# Patient Record
Sex: Female | Born: 1950
Health system: Southern US, Community
[De-identification: ages and names within clinical notes are randomized; demographics above are authoritative.]

## PROBLEM LIST (undated history)

## (undated) DIAGNOSIS — Z87898 Personal history of other specified conditions: Secondary | ICD-10-CM

## (undated) DIAGNOSIS — I6529 Occlusion and stenosis of unspecified carotid artery: Secondary | ICD-10-CM

## (undated) DIAGNOSIS — B3321 Viral endocarditis: Secondary | ICD-10-CM

## (undated) DIAGNOSIS — K219 Gastro-esophageal reflux disease without esophagitis: Secondary | ICD-10-CM

## (undated) DIAGNOSIS — E785 Hyperlipidemia, unspecified: Secondary | ICD-10-CM

## (undated) HISTORY — PX: TUBAL LIGATION: SHX77

## (undated) HISTORY — DX: Personal history of other specified conditions: Z87.898

## (undated) HISTORY — DX: Hyperlipidemia, unspecified: E78.5

## (undated) HISTORY — DX: Occlusion and stenosis of unspecified carotid artery: I65.29

---

## 1998-04-13 ENCOUNTER — Inpatient Hospital Stay (HOSPITAL_COMMUNITY): Admission: AD | Admit: 1998-04-13 | Discharge: 1998-04-22 | Payer: Self-pay | Admitting: Cardiovascular Disease

## 1998-04-21 ENCOUNTER — Encounter: Payer: Self-pay | Admitting: Cardiovascular Disease

## 2000-11-14 ENCOUNTER — Other Ambulatory Visit: Admission: RE | Admit: 2000-11-14 | Discharge: 2000-11-14 | Payer: Self-pay | Admitting: *Deleted

## 2001-03-17 ENCOUNTER — Encounter: Payer: Self-pay | Admitting: *Deleted

## 2001-03-17 ENCOUNTER — Ambulatory Visit (HOSPITAL_COMMUNITY): Admission: RE | Admit: 2001-03-17 | Discharge: 2001-03-17 | Payer: Self-pay | Admitting: *Deleted

## 2001-06-02 ENCOUNTER — Encounter: Payer: Self-pay | Admitting: Emergency Medicine

## 2001-06-02 ENCOUNTER — Emergency Department (HOSPITAL_COMMUNITY): Admission: EM | Admit: 2001-06-02 | Discharge: 2001-06-02 | Payer: Self-pay | Admitting: Emergency Medicine

## 2001-12-08 ENCOUNTER — Ambulatory Visit (HOSPITAL_COMMUNITY): Admission: RE | Admit: 2001-12-08 | Discharge: 2001-12-08 | Payer: Self-pay | Admitting: Family Medicine

## 2001-12-08 ENCOUNTER — Encounter: Payer: Self-pay | Admitting: Family Medicine

## 2002-03-20 ENCOUNTER — Encounter: Payer: Self-pay | Admitting: *Deleted

## 2002-03-20 ENCOUNTER — Ambulatory Visit (HOSPITAL_COMMUNITY): Admission: RE | Admit: 2002-03-20 | Discharge: 2002-03-20 | Payer: Self-pay | Admitting: *Deleted

## 2003-03-25 ENCOUNTER — Ambulatory Visit (HOSPITAL_COMMUNITY): Admission: RE | Admit: 2003-03-25 | Discharge: 2003-03-25 | Payer: Self-pay | Admitting: *Deleted

## 2004-06-24 ENCOUNTER — Ambulatory Visit (HOSPITAL_COMMUNITY): Admission: RE | Admit: 2004-06-24 | Discharge: 2004-06-24 | Payer: Self-pay | Admitting: Obstetrics and Gynecology

## 2005-08-20 ENCOUNTER — Ambulatory Visit (HOSPITAL_COMMUNITY): Admission: RE | Admit: 2005-08-20 | Discharge: 2005-08-20 | Payer: Self-pay | Admitting: Obstetrics and Gynecology

## 2005-11-18 ENCOUNTER — Ambulatory Visit: Payer: Self-pay | Admitting: Orthopedic Surgery

## 2005-11-25 ENCOUNTER — Encounter: Payer: Self-pay | Admitting: Orthopedic Surgery

## 2006-09-14 ENCOUNTER — Ambulatory Visit (HOSPITAL_COMMUNITY): Admission: RE | Admit: 2006-09-14 | Discharge: 2006-09-14 | Payer: Self-pay | Admitting: Obstetrics

## 2006-09-26 ENCOUNTER — Ambulatory Visit (HOSPITAL_COMMUNITY): Admission: RE | Admit: 2006-09-26 | Discharge: 2006-09-26 | Payer: Self-pay | Admitting: Obstetrics & Gynecology

## 2007-09-15 ENCOUNTER — Ambulatory Visit (HOSPITAL_COMMUNITY): Admission: RE | Admit: 2007-09-15 | Discharge: 2007-09-15 | Payer: Self-pay | Admitting: Obstetrics

## 2008-09-16 ENCOUNTER — Ambulatory Visit (HOSPITAL_COMMUNITY): Admission: RE | Admit: 2008-09-16 | Discharge: 2008-09-16 | Payer: Self-pay | Admitting: Obstetrics

## 2009-03-19 ENCOUNTER — Encounter: Payer: Self-pay | Admitting: Orthopedic Surgery

## 2009-03-19 ENCOUNTER — Ambulatory Visit (HOSPITAL_COMMUNITY): Admission: RE | Admit: 2009-03-19 | Discharge: 2009-03-19 | Payer: Self-pay | Admitting: Family Medicine

## 2009-04-24 ENCOUNTER — Ambulatory Visit (HOSPITAL_COMMUNITY): Admission: RE | Admit: 2009-04-24 | Discharge: 2009-04-24 | Payer: Self-pay | Admitting: Family Medicine

## 2009-05-19 ENCOUNTER — Ambulatory Visit: Payer: Self-pay | Admitting: Orthopedic Surgery

## 2009-05-19 DIAGNOSIS — M19019 Primary osteoarthritis, unspecified shoulder: Secondary | ICD-10-CM | POA: Insufficient documentation

## 2009-05-19 DIAGNOSIS — M542 Cervicalgia: Secondary | ICD-10-CM

## 2009-05-19 DIAGNOSIS — M25519 Pain in unspecified shoulder: Secondary | ICD-10-CM | POA: Insufficient documentation

## 2009-05-19 DIAGNOSIS — M47812 Spondylosis without myelopathy or radiculopathy, cervical region: Secondary | ICD-10-CM | POA: Insufficient documentation

## 2009-05-20 ENCOUNTER — Encounter: Payer: Self-pay | Admitting: Orthopedic Surgery

## 2009-06-02 ENCOUNTER — Encounter (HOSPITAL_COMMUNITY): Admission: RE | Admit: 2009-06-02 | Discharge: 2009-07-02 | Payer: Self-pay | Admitting: Orthopedic Surgery

## 2009-08-04 ENCOUNTER — Encounter: Payer: Self-pay | Admitting: Orthopedic Surgery

## 2009-09-18 ENCOUNTER — Ambulatory Visit (HOSPITAL_COMMUNITY): Admission: RE | Admit: 2009-09-18 | Discharge: 2009-09-18 | Payer: Self-pay | Admitting: Obstetrics

## 2009-09-26 ENCOUNTER — Telehealth: Payer: Self-pay | Admitting: Cardiovascular Disease

## 2009-10-13 ENCOUNTER — Telehealth: Payer: Self-pay | Admitting: Cardiovascular Disease

## 2009-10-17 ENCOUNTER — Ambulatory Visit (HOSPITAL_COMMUNITY): Admission: RE | Admit: 2009-10-17 | Discharge: 2009-10-17 | Payer: Self-pay | Admitting: Obstetrics

## 2010-03-26 ENCOUNTER — Emergency Department (HOSPITAL_COMMUNITY)
Admission: EM | Admit: 2010-03-26 | Discharge: 2010-03-26 | Payer: Self-pay | Source: Home / Self Care | Admitting: Emergency Medicine

## 2010-03-26 LAB — BASIC METABOLIC PANEL
BUN: 16 mg/dL (ref 6–23)
CO2: 28 mEq/L (ref 19–32)
Calcium: 9.5 mg/dL (ref 8.4–10.5)
Chloride: 102 mEq/L (ref 96–112)
Creatinine, Ser: 0.87 mg/dL (ref 0.4–1.2)
GFR calc Af Amer: >60 mL/min (ref >60)
GFR calc non Af Amer: >60 mL/min (ref >60)
Glucose, Bld: 94 mg/dL (ref 70–99)
Potassium: 3.9 mEq/L (ref 3.5–5.1)
Sodium: 139 mEq/L (ref 135–145)

## 2010-03-26 LAB — CBC
HCT: 37.1 % (ref 36.0–46.0)
Hemoglobin: 12.2 g/dL (ref 12.0–15.0)
MCH: 28.8 pg (ref 26.0–34.0)
MCHC: 32.9 g/dL (ref 30.0–36.0)
MCV: 87.7 fL (ref 78.0–100.0)
Platelets: 255 10*3/uL (ref 150–400)
RBC: 4.23 MIL/uL (ref 3.87–5.11)
RDW: 13.9 % (ref 11.5–15.5)
WBC: 5.1 10*3/uL (ref 4.0–10.5)

## 2010-03-26 LAB — DIFFERENTIAL
Basophils Absolute: 0 10*3/uL (ref 0.0–0.1)
Basophils Relative: 1 % (ref 0–1)
Eosinophils Absolute: 0.2 10*3/uL (ref 0.0–0.7)
Eosinophils Relative: 4 % (ref 0–5)
Lymphocytes Relative: 42 % (ref 12–46)
Lymphs Abs: 2.1 10*3/uL (ref 0.7–4.0)
Monocytes Absolute: 0.4 10*3/uL (ref 0.1–1.0)
Monocytes Relative: 9 % (ref 3–12)
Neutro Abs: 2.3 10*3/uL (ref 1.7–7.7)
Neutrophils Relative %: 44 % (ref 43–77)

## 2010-03-26 LAB — D-DIMER, QUANTITATIVE: D-Dimer, Quant: 0.22 ug/mL-FEU (ref 0.00–0.48)

## 2010-03-26 LAB — POCT CARDIAC MARKERS
CKMB, poc: <1 ng/mL — ABNORMAL LOW (ref 1.0–8.0)
Myoglobin, poc: 75.2 ng/mL (ref 12–200)
Troponin i, poc: <0.05 ng/mL (ref 0.00–0.09)

## 2010-04-12 ENCOUNTER — Encounter: Payer: Self-pay | Admitting: Obstetrics & Gynecology

## 2010-04-12 ENCOUNTER — Encounter: Payer: Self-pay | Admitting: Obstetrics

## 2010-04-21 NOTE — Letter (Signed)
Summary: History form  History form   Imported By: Jacklynn Ganong 05/26/2009 10:59:23  _____________________________________________________________________  External Attachment:    Type:   Image     Comment:   External Document

## 2010-04-21 NOTE — Progress Notes (Signed)
Summary: pt wants to talk about getting pre meds  Phone Note Call from Patient Call back at Home Phone [phone]   Caller: Patient Reason for Call: Talk to Nurse, Talk to Doctor Summary of Call: pt wants to talk to MD not the nurse Initial call taken by: Omer Jack,  October 13, 2009 11:06 AM  Follow-up for Phone Call        spoke with pt, records from 2000 requested from the hosp and her old chart Deliah Goody, RN  October 13, 2009 11:46 AM  records received and reviewed with dr Riley Kill. pt with a hx of myopericardis. pt aware she does not need SBE before dental procedures Deliah Goody, RN  October 13, 2009 4:19 PM

## 2010-04-21 NOTE — Progress Notes (Signed)
Summary: Initial evaluation  Initial evaluation   Imported By: Jacklynn Ganong 05/16/2009 07:38:43  _____________________________________________________________________  External Attachment:    Type:   Image     Comment:   External Document

## 2010-04-21 NOTE — Letter (Signed)
Summary: *Orthopedic Consult Note  [facility]  [address]  [place], [place] [address]   Phone: [phone]  Fax: [phone]    Re:    Nicole Guerrero DOB:    01/22/1951   Dear: Loraine Leriche   Thank you for requesting that we see the above patient for consultation.  A copy of the detailed office note will be sent under separate cover, for your review.  Evaluation today is consistent with: cervical spondylosis and a.c. joint arthrosis   Our recommendation is for: physical therapy continued Tylenol.  She will return to [place] if she has further symptoms.  She is happy with this plan.       Thank you for this opportunity to look after your patient.  Sincerely,   Terrance Mass. MD.

## 2010-04-21 NOTE — Miscellaneous (Signed)
Summary: Discharge from OT  Discharge from OT   Imported By: Jacklynn Ganong 08/15/2009 08:52:24  _____________________________________________________________________  External Attachment:    Type:   Image     Comment:   External Document

## 2010-04-21 NOTE — Assessment & Plan Note (Signed)
Summary: RT SHOULDER PAIN XR MRI AT [facility]   Vital Signs:  Patient profile:   60 year old female Height:      62 inches Weight:      144 pounds Pulse rate:   76 / minute Resp:     16 per minute  Visit Type:  Initial Consult Referring Provider:  Dr. Nobie Putnam Primary Provider:  Dr. Nobie Putnam  CC:  right shoulder pain.  History of Present Illness: This is a 60 year old female with RIGHT shoulder and neck pain who presents for evaluation.  She has had an MRI.  She had x-rays done as well.  She had an x-ray over shoulder on December 29.  She is currently complaining of pain over her RIGHT collar bone the RIGHT side of her neck the RIGHT upper arm and the RIGHT glenohumeral joint.  She's had pain for months with no injury.  She has taken some Tylenol with moderate relief he didn't do too well.  The pain is described as sharp stabbing severe, rates it an 8/10, intermittent gradual in onset she reports a catching in the RIGHT shoulder as well denies bruising numbness tingling locking or swelling for neck stiffness   Xrays of the Right shoulder taken 03/19/09.  xrays of the C spine taken 03/19/09, MRI C spine on 04/24/09 [facility] for review.  rt c5-6 foraminal narrowing   Meds: Prilosec, Actonel, Multivitamin.  Allergies (verified): No Known Drug Allergies  Past History:  Past Medical History: Acid Reflux osteopenia  Family History: FH of Cancer:  Family History of Diabetes Family History of Arthritis Hx, family, asthma  Social History: Patient is divorced.  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 Cardiac :  Denies chest pain, angina, heart attack, heart failure, poor circulation, blood clots, and phlebitis. Resp:  Denies short of breath, difficulty breathing, COPD, cough, and pneumonia. GI:  Denies nausea, vomiting, diarrhea, constipation, difficulty  swallowing, ulcers, GERD, and reflux. GU:  Denies kidney failure, kidney transplant, kidney stones, burning, poor stream, testicular cancer, blood in urine, and . Neuro:  Denies headache, dizziness, migraines, numbness, weakness, tremor, and unsteady walking. MS:  Complains of joint pain; denies rheumatoid arthritis, joint swelling, gout, bone cancer, osteoporosis, and . Endo:  Denies thyroid disease, goiter, and diabetes. Psych:  Denies depression, mood swings, anxiety, panic attack, bipolar, and schizophrenia. Derm:  Denies eczema, cancer, and itching. EENT:  Denies poor vision, cataracts, glaucoma, poor hearing, vertigo, ears ringing, sinusitis, hoarseness, toothaches, and bleeding gums. Immunology:  Complains of seasonal allergies; denies sinus problems and allergic to bee stings. Lymphatic:  Denies lymph node cancer and lymph edema.  Physical Exam  Additional Exam:  GEN: well developed, well nourished, normal grooming and hygiene, no deformity and normal body habitus.   CDV: pulses are normal, no edema, no erythema. no tenderness  Lymph: normal lymph nodes   Skin: no rashes, skin lesions or open sores   NEURO: normal coordination, reflexes, sensation.   Psyche: awake, alert and oriented. Mood normal   Gait: normal  Cervical spine inspection reveals no tenderness full range of motion negative Spurling sign.  Motor exam normal with no increased muscle tension.  No joint laxity.  RIGHT shoulder evaluation inspection reveals tenderness over the a.c. joint a positive abduction test.  Normal range of motion no impingement normal strength in the rotator cuff no joint laxity.  Impression & Recommendations:  Problem # 1:  CERVICAL SPONDYLOSIS WITHOUT MYELOPATHY (ICD-721.0) multilevel cervical disc disease with C5-C6 foraminal stenosis Orders: New Patient Level III ([address])  Problem # 2:  OSTEOARTHRITIS, SHOULDER (ICD-715.91) a.c. joint Orders: New Patient Level III  ([address]) after reviewing the reports and the films available to me which included the RIGHT shoulder the cervical spine and the MRI of the cervical spine the patient's symptoms right now controlled with Tylenol slightly simple exercise program patient education should be sufficient she will continue Tylenol and come back to [place] as needed she will get some therapy on the shoulder and the cervical spine  Other Orders: Physical Therapy Referral (PT)  Patient Instructions: 1)  Do therapy for neck and shoulder for 6 weeks 2)  Continue Tylenol 3)  Come back as needed

## 2010-04-21 NOTE — Progress Notes (Signed)
Summary: dose pt need pre med prior to dental appt  Phone Note Call from Patient Call back at Home Phone [phone]   Caller: Patient Reason for Call: Talk to Nurse, Talk to Doctor Summary of Call: Does patient have to pre med prior to dental appt Initial call taken by: Omer Jack,  September 26, 2009 10:35 AM  Follow-up for Phone Call        pt states we saw her in 2000. left her a message of what the guidelines are and that I was unable to locate her history or records of her being seen. she will call back with any questions Deliah Goody, RN  September 26, 2009 4:26 PM

## 2010-09-09 ENCOUNTER — Other Ambulatory Visit: Payer: Self-pay | Admitting: Obstetrics

## 2010-09-09 DIAGNOSIS — Z139 Encounter for screening, unspecified: Secondary | ICD-10-CM

## 2010-09-21 ENCOUNTER — Ambulatory Visit (HOSPITAL_COMMUNITY)
Admission: RE | Admit: 2010-09-21 | Discharge: 2010-09-21 | Disposition: A | Discharge status: Home / Self Care | Payer: BC Managed Care – PPO | Source: Ambulatory Visit | Attending: Obstetrics | Admitting: Obstetrics

## 2010-09-21 DIAGNOSIS — Z1231 Encounter for screening mammogram for malignant neoplasm of breast: Secondary | ICD-10-CM | POA: Insufficient documentation

## 2010-09-21 DIAGNOSIS — Z139 Encounter for screening, unspecified: Secondary | ICD-10-CM

## 2010-11-17 ENCOUNTER — Ambulatory Visit (INDEPENDENT_AMBULATORY_CARE_PROVIDER_SITE_OTHER): Payer: BC Managed Care – PPO | Admitting: Urology

## 2010-11-17 DIAGNOSIS — N952 Postmenopausal atrophic vaginitis: Secondary | ICD-10-CM

## 2010-11-17 DIAGNOSIS — N3000 Acute cystitis without hematuria: Secondary | ICD-10-CM

## 2011-05-10 ENCOUNTER — Other Ambulatory Visit: Payer: Self-pay | Admitting: Obstetrics

## 2011-05-10 DIAGNOSIS — Z139 Encounter for screening, unspecified: Secondary | ICD-10-CM

## 2011-09-27 ENCOUNTER — Ambulatory Visit (HOSPITAL_COMMUNITY)
Admission: RE | Admit: 2011-09-27 | Discharge: 2011-09-27 | Disposition: A | Discharge status: Home / Self Care | Payer: BC Managed Care – PPO | Source: Ambulatory Visit | Attending: Obstetrics | Admitting: Obstetrics

## 2011-09-27 DIAGNOSIS — Z139 Encounter for screening, unspecified: Secondary | ICD-10-CM

## 2011-09-27 DIAGNOSIS — Z1231 Encounter for screening mammogram for malignant neoplasm of breast: Secondary | ICD-10-CM | POA: Insufficient documentation

## 2011-10-18 ENCOUNTER — Emergency Department (HOSPITAL_COMMUNITY)
Admission: EM | Admit: 2011-10-18 | Discharge: 2011-10-18 | Disposition: A | Discharge status: Home / Self Care | Payer: BC Managed Care – PPO | Attending: Emergency Medicine | Admitting: Emergency Medicine

## 2011-10-18 ENCOUNTER — Encounter (HOSPITAL_COMMUNITY): Payer: Self-pay | Admitting: *Deleted

## 2011-10-18 DIAGNOSIS — Y93H9 Activity, other involving exterior property and land maintenance, building and construction: Secondary | ICD-10-CM | POA: Insufficient documentation

## 2011-10-18 DIAGNOSIS — T63461A Toxic effect of venom of wasps, accidental (unintentional), initial encounter: Secondary | ICD-10-CM | POA: Insufficient documentation

## 2011-10-18 DIAGNOSIS — Y998 Other external cause status: Secondary | ICD-10-CM | POA: Insufficient documentation

## 2011-10-18 DIAGNOSIS — T63441A Toxic effect of venom of bees, accidental (unintentional), initial encounter: Secondary | ICD-10-CM

## 2011-10-18 DIAGNOSIS — T6391XA Toxic effect of contact with unspecified venomous animal, accidental (unintentional), initial encounter: Secondary | ICD-10-CM | POA: Insufficient documentation

## 2011-10-18 MED ORDER — SODIUM CHLORIDE 0.9 % IV SOLN
INTRAVENOUS | Status: DC
  Administered 2011-10-18: 19:00:00 via INTRAVENOUS

## 2011-10-18 MED ORDER — METHYLPREDNISOLONE SODIUM SUCC 40 MG IJ SOLR
120.0000 mg | Freq: Once | INTRAMUSCULAR | Status: AC
  Administered 2011-10-18: 120 mg via INTRAVENOUS
  Filled 2011-10-18: qty 3

## 2011-10-18 MED ORDER — FAMOTIDINE IN NACL 20-0.9 MG/50ML-% IV SOLN
20.0000 mg | Freq: Once | INTRAVENOUS | Status: AC
  Administered 2011-10-18: 20 mg via INTRAVENOUS
  Filled 2011-10-18: qty 50

## 2011-10-18 MED ORDER — DIPHENHYDRAMINE HCL 50 MG/ML IJ SOLN
50.0000 mg | Freq: Once | INTRAMUSCULAR | Status: AC
  Administered 2011-10-18: 50 mg via INTRAVENOUS
  Filled 2011-10-18: qty 1

## 2011-10-18 MED ORDER — PREDNISONE 20 MG PO TABS: ORAL_TABLET | ORAL | Status: DC

## 2011-10-18 MED ORDER — FAMOTIDINE 20 MG PO TABS: 20.0000 mg | ORAL_TABLET | Freq: Two times a day (BID) | ORAL | Status: DC

## 2011-10-18 NOTE — ED Notes (Signed)
Multiple bee stings just PTA while working in yard. Denies SOB. Welts all over arms, legs, and back.

## 2011-10-18 NOTE — ED Provider Notes (Cosign Needed)
History     CSN: [id number]  Arrival date & time 10/18/11  1711   First MD Initiated Contact with Patient 10/18/11 1824      Chief Complaint  Patient presents with  . Multiple bee stings     (Consider location/radiation/quality/duration/timing/severity/associated sxs/prior treatment) HPI  Patient relates she had been raking pairs that had fallen on the ground and was pulling weeds when some yellow jackets came out of the ground and stung her approximately 5 PM. She states she washed all the sites and then put on some cream that she had at home. She also took a Zyrtec. She states she's never had reactions to bee stings in the past but she's never been stung many times. She states she has counted 9 sting sites. She denies having swelling in her throat but she states she did have some numbness and a feeling of swelling of her lips. She denies chest pain.  PCP Dr. Phillips Odor  History reviewed. No pertinent past medical history.  Past Surgical History  Procedure Date  . Tubal ligation     No family history on file.  History  Substance Use Topics  . Smoking status: Never Smoker   . Smokeless tobacco: Not on file  . Alcohol Use: No  employed  OB History    Grav Para Term Preterm Abortions TAB SAB Ect Mult Living                  Review of Systems  All other systems reviewed and are negative.    Allergies  Review of patient's allergies indicates no known allergies.  Home Medications   Current Outpatient Rx  Name Route Sig Dispense Refill  . CETIRIZINE HCL 10 MG PO TABS Oral Take 10 mg by mouth once as needed. For allergic reaction    . VITAMIN D 2000 UNITS PO CAPS Oral Take 1 capsule by mouth every morning.    . ADULT MULTIVITAMIN W/MINERALS CH Oral Take 1 tablet by mouth every morning.    Marland Kitchen OMEPRAZOLE 20 MG PO CPDR Oral Take 20 mg by mouth every morning.      BP 140/68  Pulse 99  Temp 98.4 F (36.9 C) (Oral)  Resp 20  Ht 5\' 2"  (1.575 m)  Wt 140 lb (63.504 kg)   BMI 25.61 kg/m2  SpO2 98%  Vital signs normal    Physical Exam  Nursing note and vitals reviewed. Constitutional: She is oriented to person, place, and time. She appears well-developed and well-nourished.  Non-toxic appearance. She does not appear ill. No distress.  HENT:  Head: Normocephalic and atraumatic.  Right Ear: External ear normal.  Left Ear: External ear normal.  Nose: Nose normal. No mucosal edema or rhinorrhea.  Mouth/Throat: Oropharynx is clear and moist and mucous membranes are normal. No dental abscesses or uvula swelling.  Eyes: Conjunctivae and EOM are normal. Pupils are equal, round, and reactive to light.  Neck: Normal range of motion and full passive range of motion without pain. Neck supple.  Cardiovascular: Normal rate, regular rhythm and normal heart sounds.  Exam reveals no gallop and no friction rub.   No murmur heard. Pulmonary/Chest: Effort normal and breath sounds normal. No respiratory distress. She has no wheezes. She has no rhonchi. She has no rales. She exhibits no tenderness and no crepitus.  Abdominal: Soft. Normal appearance and bowel sounds are normal. She exhibits no distension. There is no tenderness. There is no rebound and no guarding.  Musculoskeletal: Normal range of  motion. She exhibits no edema and no tenderness.       Moves all extremities well.   Neurological: She is alert and oriented to person, place, and time. She has normal strength. No cranial nerve deficit.  Skin: Skin is warm, dry and intact. No rash noted. There is erythema. No pallor.       Patient's noted to have some swelling of the dorsum of her right hand with some mild redness. She is noted to have another sting site on her right forearm with erythema that is approximately 5 cm in size, she is noted to have 2 areas on her left forearm also over 5 cm in size with erythema, just 2 smaller areas of the dorsum of both her feet near the MTPs of her middle toes that are consistent with  sting sites. Her back has diffuse scattered redness.  Psychiatric: She has a normal mood and affect. Her speech is normal and behavior is normal. Her mood appears not anxious.    ED Course  Procedures (including critical care time)   Medications  0.9 %  sodium chloride infusion (not administered)  diphenhydrAMINE (BENADRYL) injection 50 mg (not administered)  methylPREDNISolone sodium succinate (SOLU-MEDROL) 40 mg/mL injection 120 mg (not administered)  famotidine (PEPCID) IVPB 20 mg (not administered)    Recheck her lesions are fading, she is feeling better.   1. Bee sting reaction     New Prescriptions   FAMOTIDINE (PEPCID) 20 MG TABLET    Take 1 tablet (20 mg total) by mouth 2 (two) times daily.   PREDNISONE (DELTASONE) 20 MG TABLET    Take 3 po QD x 2d starting tomorrow, then 2 po QD x 3d then 1 po QD x 3d    Plan discharge  Devoria Albe, MD, Armando Gang   MDM          Ward Givens, MD 10/18/11 2016

## 2011-10-18 NOTE — ED Notes (Signed)
Discharge instructions reviewed with pt, questions answered. Pt verbalized understanding.  

## 2011-10-18 NOTE — ED Notes (Signed)
Pt took a zyrtec PTA

## 2012-09-05 ENCOUNTER — Other Ambulatory Visit: Payer: Self-pay | Admitting: Obstetrics

## 2012-09-05 DIAGNOSIS — Z139 Encounter for screening, unspecified: Secondary | ICD-10-CM

## 2012-09-28 ENCOUNTER — Ambulatory Visit (HOSPITAL_COMMUNITY)
Admission: RE | Admit: 2012-09-28 | Discharge: 2012-09-28 | Disposition: A | Discharge status: Home / Self Care | Payer: BC Managed Care – PPO | Source: Ambulatory Visit | Attending: Obstetrics | Admitting: Obstetrics

## 2012-09-28 DIAGNOSIS — Z139 Encounter for screening, unspecified: Secondary | ICD-10-CM

## 2012-09-28 DIAGNOSIS — Z1231 Encounter for screening mammogram for malignant neoplasm of breast: Secondary | ICD-10-CM | POA: Insufficient documentation

## 2013-05-04 ENCOUNTER — Emergency Department (HOSPITAL_COMMUNITY)
Admission: EM | Admit: 2013-05-04 | Discharge: 2013-05-04 | Disposition: A | Discharge status: Home / Self Care | Payer: BC Managed Care – PPO | Attending: Emergency Medicine | Admitting: Emergency Medicine

## 2013-05-04 ENCOUNTER — Emergency Department (HOSPITAL_COMMUNITY): Payer: BC Managed Care – PPO

## 2013-05-04 ENCOUNTER — Encounter (HOSPITAL_COMMUNITY): Payer: Self-pay | Admitting: Emergency Medicine

## 2013-05-04 DIAGNOSIS — W108XXA Fall (on) (from) other stairs and steps, initial encounter: Secondary | ICD-10-CM | POA: Insufficient documentation

## 2013-05-04 DIAGNOSIS — Z79899 Other long term (current) drug therapy: Secondary | ICD-10-CM | POA: Insufficient documentation

## 2013-05-04 DIAGNOSIS — S8002XA Contusion of left knee, initial encounter: Secondary | ICD-10-CM

## 2013-05-04 DIAGNOSIS — Y929 Unspecified place or not applicable: Secondary | ICD-10-CM | POA: Insufficient documentation

## 2013-05-04 DIAGNOSIS — K219 Gastro-esophageal reflux disease without esophagitis: Secondary | ICD-10-CM | POA: Insufficient documentation

## 2013-05-04 DIAGNOSIS — S93401A Sprain of unspecified ligament of right ankle, initial encounter: Secondary | ICD-10-CM

## 2013-05-04 DIAGNOSIS — Z87891 Personal history of nicotine dependence: Secondary | ICD-10-CM | POA: Insufficient documentation

## 2013-05-04 DIAGNOSIS — Y939 Activity, unspecified: Secondary | ICD-10-CM | POA: Insufficient documentation

## 2013-05-04 DIAGNOSIS — S8000XA Contusion of unspecified knee, initial encounter: Secondary | ICD-10-CM | POA: Insufficient documentation

## 2013-05-04 DIAGNOSIS — S93409A Sprain of unspecified ligament of unspecified ankle, initial encounter: Secondary | ICD-10-CM | POA: Insufficient documentation

## 2013-05-04 HISTORY — DX: Gastro-esophageal reflux disease without esophagitis: K21.9

## 2013-05-04 NOTE — ED Provider Notes (Signed)
CSN: [id number]     Arrival date & time 05/04/13  1744 History   First MD Initiated Contact with Patient 05/04/13 1807     Chief Complaint  Patient presents with  . Fall     (Consider location/radiation/quality/duration/timing/severity/associated sxs/prior Treatment) HPI Comments: Nicole Guerrero is a 63 y.o. female who presents to the Emergency Department complaining of pain to her left knee and right lower leg and ankle.  States she "missed a step" and fell forward onto her knee.  Injury occurred just PTA.  She states she is able to bear weight, but has pain to her ankle and and left knee "feels like its going to give away".  Pain improves with rest, she denies head injury, LOC, dizziness, back or neck pain.  She has not tried any therapies prior to ed arrival.    The history is provided by the patient.    Past Medical History  Diagnosis Date  . GERD (gastroesophageal reflux disease)    Past Surgical History  Procedure Laterality Date  . Tubal ligation     Family History  Problem Relation Age of Onset  . Diabetes Other   . Cancer Other    History  Substance Use Topics  . Smoking status: Former Smoker -- 0.03 packs/day for 6 years    Types: Cigarettes    Quit date: 03/22/1997  . Smokeless tobacco: Never Used  . Alcohol Use: No   OB History   Grav Para Term Preterm Abortions TAB SAB Ect Mult Living   3 3 3       3      Review of Systems  Constitutional: Negative for fever and chills.  Gastrointestinal: Negative for abdominal pain.  Genitourinary: Negative for dysuria and difficulty urinating.  Musculoskeletal: Positive for arthralgias and gait problem. Negative for back pain, joint swelling and neck pain.  Skin: Negative for color change and wound.  Neurological: Negative for dizziness, syncope, weakness, numbness and headaches.  All other systems reviewed and are negative.      Allergies  Review of patient's allergies indicates no known allergies.  Home  Medications   Current Outpatient Rx  Name  Route  Sig  Dispense  Refill  . Cholecalciferol (VITAMIN D) 2000 UNITS CAPS   Oral   Take 1 capsule by mouth every morning.         . Multiple Vitamin (MULTIVITAMIN WITH MINERALS) TABS   Oral   Take 1 tablet by mouth every morning.         Marland Kitchen omeprazole (PRILOSEC OTC) 20 MG tablet   Oral   Take 20 mg by mouth daily.          BP 133/77  Pulse 76  Temp(Src) 97.9 F (36.6 C) (Oral)  Resp 18  Ht 5\' 2"  (1.575 m)  Wt 142 lb (64.411 kg)  BMI 25.97 kg/m2  SpO2 98% Physical Exam  Nursing note and vitals reviewed. Constitutional: She is oriented to person, place, and time. She appears well-developed and well-nourished. No distress.  HENT:  Head: Normocephalic and atraumatic.  Neck: Normal range of motion.  Cardiovascular: Normal rate, regular rhythm, normal heart sounds and intact distal pulses.   Pulmonary/Chest: Effort normal and breath sounds normal. She exhibits no tenderness.  Musculoskeletal: She exhibits tenderness. She exhibits no edema.       Left knee: She exhibits normal range of motion, no swelling, no effusion, no deformity, no erythema, normal alignment and no bony tenderness. Tenderness found. No patellar tendon tenderness  noted.       Right ankle: She exhibits normal range of motion, no swelling, no ecchymosis, no deformity, no laceration and normal pulse. Tenderness. Lateral malleolus tenderness found. No head of 5th metatarsal and no proximal fibula tenderness found. Achilles tendon normal.       Legs: ttp of the anterior left knee.  No erythema, effusion, or step-off deformity. ttp of the lateral right ankle. No bruising, bony deformity or STS.  DP pulse brisk, distal sensation intact. Calf is soft and NT. Pt has full ROM of bilateral hips.  No spinal tenderness.  Neurological: She is alert and oriented to person, place, and time. She exhibits normal muscle tone. Coordination normal.  Skin: Skin is warm and dry. No  erythema.    ED Course  Procedures (including critical care time) Labs Review Labs Reviewed - No data to display Imaging Review Dg Tibia/fibula Right  05/04/2013   CLINICAL DATA:  Fall, foot and leg pain  EXAM: RIGHT TIBIA AND FIBULA - 2 VIEW  COMPARISON:  Concurrently obtained radiographs of the right foot and left knee  FINDINGS: Remote healed fibula fracture. No evidence of acute fracture or malalignment. Mild degenerative change noted in the incompletely imaged knee joint. There is been narrowing in the medial joint space and early endplate sclerosis. Normal bony mineralization. No lytic or blastic osseous lesion.  IMPRESSION: 1. No acute fracture or malalignment. 2. Remote healed right fibular fracture. 3. Incompletely imaged degenerative changes in the knee joint.   Electronically Signed   By: Jacqulynn Cadet M.D.   On: 05/04/2013 19:06   Dg Knee Complete 4 Views Left  05/04/2013   CLINICAL DATA:  [place] on stairs, right foot and leg pain  EXAM: LEFT KNEE - COMPLETE 4+ VIEW  COMPARISON:  Concurrently obtained radiographs of the foot and left lower leg  FINDINGS: There is no evidence of fracture, dislocation, or joint effusion. Narrowing of the medial joint space consistent with mild osteoarthritis. Osteophyte formation is noted at the medial tibial plateau. Normal bony mineralization. No lytic or blastic osseous lesion. . Soft tissues are unremarkable.  IMPRESSION: No acute fracture or malalignment.  No joint effusion.  Mild degenerative changes in the medial compartment.   Electronically Signed   By: Jacqulynn Cadet M.D.   On: 05/04/2013 19:05   Dg Foot Complete Right  05/04/2013   CLINICAL DATA:  Fall.  EXAM: RIGHT FOOT COMPLETE - 3+ VIEW  COMPARISON:  None.  FINDINGS: There is no evidence of fracture or dislocation. There is no evidence of arthropathy or other focal bone abnormality. Soft tissues are unremarkable.  IMPRESSION: Negative.   Electronically Signed   By: Marin Olp M.D.   On:  05/04/2013 19:01    EKG Interpretation   None       X-ray reviewed and discussed.    MDM   Final diagnoses:  Sprain of right ankle  Contusion of knee, left   Vital reviewed, x rays reviewed and discussed with pt.  Pt with a fall to the right left and probable twisting injury of the left knee.  Ambulates with a slow but steady gait.  No NV deficits.  Has full ROM of the bilateral hips, pt agrees to RICE therapy and f/u with Dr. Aline Brochure for recheck  Patient was offered pain medication but declined stating that she has Tylenol at home and feels that she will be sufficient with that.  Wille Aubuchon L. [place], PA-C 05/06/13 1211

## 2013-05-04 NOTE — Discharge Instructions (Signed)
Ankle Sprain °An ankle sprain is an injury to the strong, fibrous tissues (ligaments) that hold your ankle bones together.  °HOME CARE  °· Put ice on your ankle for 1 2 days or as told by your doctor. °· Put ice in a plastic bag. °· Place a towel between your skin and the bag. °· Leave the ice on for 15-20 minutes at a time, every 2 hours while you are awake. °· Only take medicine as told by your doctor. °· Raise (elevate) your injured ankle above the level of your heart as much as possible for 2 3 days. °· Use crutches if your doctor tells you to. Slowly put your own weight on the affected ankle. Use the crutches until you can walk without pain. °· If you have a plaster splint: °· Do not rest it on anything harder than a pillow for 24 hours. °· Do not put weight on it. °· Do not get it wet. °· Take it off to shower or bathe. °· If given, use an elastic wrap or support stocking for support. Take the wrap off if your toes lose feeling (numb), tingle, or turn cold or blue. °· If you have an air splint: °· Add or let out air to make it comfortable. °· Take it off at night and to shower and bathe. °· Wiggle your toes and move your ankle up and down often while you are wearing it. °GET HELP RIGHT AWAY IF:  °· Your toes lose feeling (numb) or turn blue. °· You have severe pain that is increasing. °· You have rapidly increasing bruising or puffiness (swelling). °· Your toes feel very cold. °· You lose feeling in your foot. °· Your medicine does not help your pain. °MAKE SURE YOU:  °· Understand these instructions. °· Will watch your condition. °· Will get help right away if you are not doing well or get worse. °Document Released: 08/25/2007 Document Revised: 12/01/2011 Document Reviewed: 09/20/2011 °[employer]® Patient Information ©2014 [employer]. ° °Contusion °A contusion is a deep bruise. Contusions happen when an injury causes bleeding under the skin. Signs of bruising include pain, puffiness (swelling), and  discolored skin. The contusion may turn blue, purple, or yellow. °HOME CARE  °· Put ice on the injured area. °· Put ice in a plastic bag. °· Place a towel between your skin and the bag. °· Leave the ice on for 15-20 minutes, 03-04 times a day. °· Only take medicine as told by your doctor. °· Rest the injured area. °· If possible, raise (elevate) the injured area to lessen puffiness. °GET HELP RIGHT AWAY IF:  °· You have more bruising or puffiness. °· You have pain that is getting worse. °· Your puffiness or pain is not helped by medicine. °MAKE SURE YOU:  °· Understand these instructions. °· Will watch your condition. °· Will get help right away if you are not doing well or get worse. °Document Released: 08/25/2007 Document Revised: 05/31/2011 Document Reviewed: 01/11/2011 °[employer]® Patient Information ©2014 [employer]. ° °

## 2013-05-04 NOTE — ED Notes (Signed)
Alert, Pain rt foot, lt knee and  Rt lower leg, fell down 1 step. No HI, no LOC>

## 2013-05-04 NOTE — ED Notes (Signed)
Patient reports falling down steps. Patient c/o right foot pain and left knee pain. Patient denies hitting head or LOC. Notable limping noted when patient ambulated to triage.

## 2013-05-06 NOTE — ED Provider Notes (Signed)
  Medical screening examination/treatment/procedure(s) were performed by non-physician practitioner and as supervising physician I was immediately available for consultation/collaboration.      Carmin Muskrat, MD 05/06/13 [phone]

## 2013-05-30 ENCOUNTER — Other Ambulatory Visit (HOSPITAL_COMMUNITY): Payer: Self-pay | Admitting: Family Medicine

## 2013-05-30 DIAGNOSIS — M5030 Other cervical disc degeneration, unspecified cervical region: Secondary | ICD-10-CM

## 2013-05-30 DIAGNOSIS — M5020 Other cervical disc displacement, unspecified cervical region: Secondary | ICD-10-CM

## 2013-06-05 ENCOUNTER — Ambulatory Visit (HOSPITAL_COMMUNITY)
Admission: RE | Admit: 2013-06-05 | Discharge: 2013-06-05 | Disposition: A | Discharge status: Home / Self Care | Payer: BC Managed Care – PPO | Source: Ambulatory Visit | Attending: Family Medicine | Admitting: Family Medicine

## 2013-06-05 DIAGNOSIS — M47812 Spondylosis without myelopathy or radiculopathy, cervical region: Secondary | ICD-10-CM | POA: Insufficient documentation

## 2013-06-05 DIAGNOSIS — M5020 Other cervical disc displacement, unspecified cervical region: Secondary | ICD-10-CM | POA: Insufficient documentation

## 2013-06-05 DIAGNOSIS — M5030 Other cervical disc degeneration, unspecified cervical region: Secondary | ICD-10-CM | POA: Insufficient documentation

## 2013-08-06 ENCOUNTER — Other Ambulatory Visit: Payer: Self-pay | Admitting: Neurology

## 2013-08-06 ENCOUNTER — Ambulatory Visit (INDEPENDENT_AMBULATORY_CARE_PROVIDER_SITE_OTHER): Payer: BC Managed Care – PPO | Admitting: Neurology

## 2013-08-06 ENCOUNTER — Encounter: Payer: Self-pay | Admitting: Neurology

## 2013-08-06 VITALS — BP 118/76 | HR 81 | Ht 62.21 in | Wt 140.1 lb

## 2013-08-06 DIAGNOSIS — R937 Abnormal findings on diagnostic imaging of other parts of musculoskeletal system: Secondary | ICD-10-CM

## 2013-08-06 NOTE — Progress Notes (Signed)
NEUROLOGY CONSULTATION NOTE  Nicole Guerrero MRN: [id number] DOB: 09/15/1950  Referring provider: Dr. Hal Neer Primary care provider: Dr. Hilma Favors  Reason for consult:  Abnormal cervical MRI  HISTORY OF PRESENT ILLNESS: Nicole Guerrero is a 63 year old right-handed AA woman with GERD and remote history of viral endocarditis who presents for neck pain and signal abnormality on cervical MRI.Marland Kitchen  Records and images were personally reviewed.    She is suffered from right-sided neck pain for at least 2 years. The pain radiates on the right, from the shoulder up to the right side of her occipital region. It is exacerbated with movement of her head to the right. There is no shooting pain down the arm nor numbness involving the arms. She denies any numbness in the legs. She did report cramping on the right leg and right arm, which has since resolved after starting vitamin D supplements 4 vitamin D deficiency. Occasionally, she notes soreness in the right hip and her right leg as a sensation that it's a little weaker. She was treated by a local neurologist in [facility] about a couple of years ago. She reports an MRI of the brain at that time, but results are not available. She had undergone physical therapy which helped. More recently, the neck pain got worse. She had an MRI of the cervical spine without contrast performed on 06/05/13, which showed hyperintensity noted in the right lateral cord at the C5-6 level, measuring 5 mm on axial and 18 mm on sagittal images, not present on prior scan from 04/25/09.  Mild to moderate stenosis is noted at that level as well. She was referred to Dr. Hal Neer of neurosurgery, who thought evaluation by neurology was more appropriate.    She denies prior history of numbness of the body with or without a sensory level. She denies bowel or bladder incontinence. She denies episodes of transient visual disturbance. She has not had any falls.    PAST MEDICAL HISTORY: Past Medical  History  Diagnosis Date  . GERD (gastroesophageal reflux disease)     PAST SURGICAL HISTORY: Past Surgical History  Procedure Laterality Date  . Tubal ligation      MEDICATIONS: Current Outpatient Prescriptions on File Prior to Visit  Medication Sig Dispense Refill  . Cholecalciferol (VITAMIN D) 2000 UNITS CAPS Take 1 capsule by mouth every morning.      . Multiple Vitamin (MULTIVITAMIN WITH MINERALS) TABS Take 1 tablet by mouth every morning.      Marland Kitchen omeprazole (PRILOSEC OTC) 20 MG tablet Take 20 mg by mouth daily.       No current facility-administered medications on file prior to visit.    ALLERGIES: No Known Allergies  FAMILY HISTORY: Family History  Problem Relation Age of Onset  . Diabetes Other   . Cancer Other     SOCIAL HISTORY: History   Social History  . Marital Status: Divorced    Spouse Name: N/A    Number of Children: N/A  . Years of Education: N/A   Occupational History  . Not on file.   Social History Main Topics  . Smoking status: Former Smoker -- 0.03 packs/day for 6 years    Types: Cigarettes    Quit date: 03/22/1997  . Smokeless tobacco: Never Used  . Alcohol Use: No  . Drug Use: No  . Sexual Activity: Not on file   Other Topics Concern  . Not on file   Social History Narrative  . No narrative on file  REVIEW OF SYSTEMS: Constitutional: No fevers, chills, or sweats, no generalized fatigue, change in appetite Eyes: No visual changes, double vision, eye pain Ear, nose and throat: No hearing loss, ear pain, nasal congestion, sore throat Cardiovascular: No chest pain, palpitations Respiratory:  No shortness of breath at rest or with exertion, wheezes GastrointestinaI: No nausea, vomiting, diarrhea, abdominal pain, fecal incontinence Genitourinary:  No dysuria, urinary retention or frequency Musculoskeletal:  Neck pain, slight right hip discomfort Integumentary: No rash, pruritus, skin lesions Neurological: as above Psychiatric: No  depression, insomnia, anxiety Endocrine: No palpitations, fatigue, diaphoresis, mood swings, change in appetite, change in weight, increased thirst Hematologic/Lymphatic:  No anemia, purpura, petechiae. Allergic/Immunologic: no itchy/runny eyes, nasal congestion, recent allergic reactions, rashes  PHYSICAL EXAM: Filed Vitals:   08/06/13 1441  BP: 118/76  Pulse: 81   General: No acute distress Head:  Normocephalic/atraumatic Neck: supple, right sided tenderness, full range of motion Back: No paraspinal tenderness Heart: regular rate and rhythm Lungs: Clear to auscultation bilaterally. Vascular: No carotid bruits. Neurological Exam: Mental status: alert and oriented to person, place, and time, recent and remote memory intact, fund of knowledge intact, attention and concentration intact, speech fluent and not dysarthric, language intact. Cranial nerves: CN I: not tested CN II: pupils equal, round and reactive to light, visual fields intact, fundi unremarkable, without vessel changes, exudates, hemorrhages or papilledema. CN III, IV, VI:  full range of motion, no nystagmus, no ptosis CN V: facial sensation intact CN VII: upper and lower face symmetric CN VIII: hearing intact CN IX, X: gag intact, uvula midline CN XI: sternocleidomastoid and trapezius muscles intact CN XII: tongue midline Bulk & Tone: normal, no fasciculations. Motor: 5 out of 5 throughout Sensation: Pinprick and vibration intact Deep Tendon Reflexes: 2+ throughout, toes downgoing Finger to nose testing: No dysmetria Heel to shin: No dysmetria Gait: Normal station and stride. Patient able to turn, walk on toes, and walk on heels and in tandem. Romberg negative.  IMPRESSION:  abnormal cervical spine MRI.  Etiology is broad.  She has no clear history for a demyelinating event.  PLAN: 1.  I preferred to get MRI of cervical spine with contrast to look for underlying malignancy, however patient objects to any IV dye  due to her prior history of viral endocarditis. 2.  She will get her prior imaging, including of the brain.  At that point, we may repeat an MRI of the brain.  Again, I would prefer to get with and without contrast, but she refuses this. 3.  Check serum B12, ANA, ESR, RF, Lyme 4.  Next step would be LP to evaluate CSF for cell count, protein, glucose, oligoclonal bands, IgG index, cytology, flow cytometry, Lyme 5.  Follow up in 4 weeks.  45 minutes spent with patient, over 50% spent reviewing MRI with her, counseling and coordinating care.  Thank you for allowing me to take part in the care of this patient.  Metta Clines, DO  CC:  Karie Chimera, MD  Sharilyn Sites, MD

## 2013-08-06 NOTE — Patient Instructions (Signed)
1.  Get the MRIs you had in the past over to [place]. 2.  In the meantime, we will order MRI of the spinal cord in the neck and of the brain. 3.  We will check some blood work 4.  Likely, we will need to do a spinal tap following the MRI.

## 2013-08-07 LAB — RHEUMATOID FACTOR

## 2013-08-07 LAB — B. BURGDORFI ANTIBODIES: B BURGDORFERI AB IGG+ IGM: 0.51 {ISR}

## 2013-08-07 LAB — ANTI-NUCLEAR AB-TITER (ANA TITER)

## 2013-08-07 LAB — VITAMIN B12: Vitamin B-12: 690 pg/mL (ref 211–911)

## 2013-08-07 LAB — SEDIMENTATION RATE: SED RATE: 8 mm/h (ref 0–22)

## 2013-08-07 LAB — ANA: ANA: POSITIVE — AB

## 2013-08-14 ENCOUNTER — Telehealth: Payer: Self-pay | Admitting: *Deleted

## 2013-08-14 ENCOUNTER — Other Ambulatory Visit: Payer: Self-pay | Admitting: *Deleted

## 2013-08-14 ENCOUNTER — Telehealth: Payer: Self-pay | Admitting: Neurology

## 2013-08-14 DIAGNOSIS — R9389 Abnormal findings on diagnostic imaging of other specified body structures: Secondary | ICD-10-CM

## 2013-08-14 NOTE — Telephone Encounter (Signed)
Pt called/returning your call 10:05AM.

## 2013-08-14 NOTE — Telephone Encounter (Signed)
Message copied by Claudie Revering on Tue Aug 14, 2013  8:49 AM ------      Message from: JAFFE, ADAM R      Created: Thu Aug 09, 2013  7:09 AM       The ANA is positive.  This is a nonspecific test for an inflammatory condition.  The other inflammatory markers we tested were normal, so this may be of uncertain significance.  Some people have a positive ANA with no clinical symptoms of anything.  I would like to test more specific things that may cause a positive ANA, just to be complete.  I would like to check an Extractable Nuclear Antigen (ENA) panel.  All other tests were normal.      ----- Message -----         From: [facility]         Sent: 08/07/2013   1:39 PM           To: Dudley Major, DO                   ------

## 2013-08-14 NOTE — Telephone Encounter (Signed)
Patient is aware of the lab results and was ask to contact office to discuss lab results and ENA testing

## 2013-08-15 ENCOUNTER — Telehealth: Payer: Self-pay | Admitting: Neurology

## 2013-08-15 ENCOUNTER — Other Ambulatory Visit: Payer: Self-pay | Admitting: *Deleted

## 2013-08-15 NOTE — Telephone Encounter (Signed)
Pt called returning your call at 10:05AM C/b [phone]

## 2013-08-15 NOTE — Telephone Encounter (Signed)
I mailed patients lab slip to her for the ENA panel 9  To her with addresses for 2 locations close to where she lives and the times that they are open . Patients is aware

## 2013-08-21 ENCOUNTER — Telehealth: Payer: Self-pay | Admitting: Neurology

## 2013-08-21 LAB — ENA 9 PANEL
Centromere Ab Screen: <1
ENA SM AB SER-ACNC: NEGATIVE
Jo-1 Antibody, IgG: <1
Ribosomal P Protein Ab: <1
SM/RNP: NEGATIVE
SSA (RO) (ENA) ANTIBODY, IGG: NEGATIVE
SSB (La) (ENA) Antibody, IgG: <1
Scleroderma (Scl-70) (ENA) Antibody, IgG: <1
ds DNA Ab: <1 IU/mL

## 2013-08-21 NOTE — Telephone Encounter (Signed)
Pt has had her blood work done on 08-20-13

## 2013-08-23 ENCOUNTER — Telehealth: Payer: Self-pay | Admitting: *Deleted

## 2013-08-23 NOTE — Telephone Encounter (Signed)
Message copied by Claudie Revering on Thu Aug 23, 2013  9:18 AM ------      Message from: JAFFE, ADAM R      Created: Tue Aug 21, 2013  3:06 PM       Follow up blood work does not reveal any abnormalities to suggest a rheumatological condition.  Likely the positive ANA is of uncertain significance and not likely clinically relevant.       ----- Message -----         From: [facility]         Sent: 08/21/2013   2:10 PM           To: Dudley Major, DO                   ------

## 2013-08-23 NOTE — Telephone Encounter (Signed)
Left message on patients voice mail that labs did not show a rheumatological condition

## 2013-09-19 ENCOUNTER — Other Ambulatory Visit: Payer: Self-pay | Admitting: Obstetrics

## 2013-09-19 DIAGNOSIS — Z1231 Encounter for screening mammogram for malignant neoplasm of breast: Secondary | ICD-10-CM

## 2013-10-01 ENCOUNTER — Ambulatory Visit (HOSPITAL_COMMUNITY)
Admission: RE | Admit: 2013-10-01 | Discharge: 2013-10-01 | Disposition: A | Discharge status: Home / Self Care | Payer: BC Managed Care – PPO | Source: Ambulatory Visit | Attending: Obstetrics | Admitting: Obstetrics

## 2013-10-01 DIAGNOSIS — Z1231 Encounter for screening mammogram for malignant neoplasm of breast: Secondary | ICD-10-CM | POA: Insufficient documentation

## 2013-10-22 ENCOUNTER — Ambulatory Visit (INDEPENDENT_AMBULATORY_CARE_PROVIDER_SITE_OTHER): Payer: BC Managed Care – PPO | Admitting: Obstetrics

## 2013-10-22 ENCOUNTER — Encounter: Payer: Self-pay | Admitting: Obstetrics

## 2013-10-22 VITALS — BP 140/82 | HR 73 | Temp 98.5°F | Ht 62.0 in | Wt 140.0 lb

## 2013-10-22 DIAGNOSIS — IMO0001 Reserved for inherently not codable concepts without codable children: Secondary | ICD-10-CM | POA: Insufficient documentation

## 2013-10-22 DIAGNOSIS — IMO0002 Reserved for concepts with insufficient information to code with codable children: Secondary | ICD-10-CM

## 2013-10-22 DIAGNOSIS — Z01419 Encounter for gynecological examination (general) (routine) without abnormal findings: Secondary | ICD-10-CM

## 2013-10-23 ENCOUNTER — Encounter: Payer: Self-pay | Admitting: Obstetrics

## 2013-10-23 ENCOUNTER — Telehealth: Payer: Self-pay

## 2013-10-23 LAB — WET PREP BY MOLECULAR PROBE
Candida species: NEGATIVE
GARDNERELLA VAGINALIS: NEGATIVE
TRICHOMONAS VAG: NEGATIVE

## 2013-10-23 NOTE — Telephone Encounter (Signed)
Spoke with patient - referral in [facility] for Dr Dietrich Pates Silva's office - I called them and they said they would schedule appt and call patient - work in [facility] - called patient to let her know, 10/23/13

## 2013-10-23 NOTE — Progress Notes (Signed)
Subjective:     Nicole Guerrero is a 63 y.o. female here for a routine exam.  Current complaints: None.    Personal health questionnaire:  Is patient Nicole Guerrero, have a family history of breast and/or ovarian cancer: no Is there a family history of uterine cancer diagnosed at age < 50, gastrointestinal cancer, urinary tract cancer, family member who is a Field seismologist syndrome-associated carrier: no Is the patient overweight and hypertensive, family history of diabetes, personal history of gestational diabetes or PCOS: no Is patient over 16, have PCOS,  family history of premature CHD under age 26, diabetes, smoke, have hypertension or peripheral artery disease:  no At any time, has a partner hit, kicked or otherwise hurt or frightened you?: no Over the past 2 weeks, have you felt down, depressed or hopeless?: no Over the past 2 weeks, have you felt little interest or pleasure in doing things?:no   Gynecologic History No LMP recorded. Patient is postmenopausal. Contraception: Tubal ligation Last Pap: 2014. Results were: normal Last mammogram: 2014. Results were: normal  Obstetric History OB History  Gravida Para Term Preterm AB SAB TAB Ectopic Multiple Living  3 3 3       3     # Outcome Date GA Lbr Len/2nd Weight Sex Delivery Anes PTL Lv  3 TRM           2 TRM           1 TRM               Past Medical History  Diagnosis Date  . GERD (gastroesophageal reflux disease)     Past Surgical History  Procedure Laterality Date  . Tubal ligation      Current outpatient prescriptions:Cholecalciferol (VITAMIN D) 2000 UNITS CAPS, Take 1 capsule by mouth every morning., Disp: , Rfl: ;  Multiple Vitamin (MULTIVITAMIN WITH MINERALS) TABS, Take 1 tablet by mouth every morning., Disp: , Rfl: ;  omeprazole (PRILOSEC OTC) 20 MG tablet, Take 20 mg by mouth daily., Disp: , Rfl:  No Known Allergies  History  Substance Use Topics  . Smoking status: Former Smoker -- 0.03 packs/day for 6 years   Types: Cigarettes    Quit date: 03/22/1997  . Smokeless tobacco: Never Used  . Alcohol Use: No    Family History  Problem Relation Age of Onset  . Diabetes Other   . Cancer Other       Review of Systems  Constitutional: negative for fatigue and weight loss Respiratory: negative for cough and wheezing Cardiovascular: negative for chest pain, fatigue and palpitations Gastrointestinal: negative for abdominal pain and change in bowel habits Musculoskeletal:negative for myalgias Neurological: negative for gait problems and tremors Behavioral/Psych: negative for abusive relationship, depression Endocrine: negative for temperature intolerance   Genitourinary:negative for abnormal menstrual periods, genital lesions, hot flashes, sexual problems and vaginal discharge.  Positive for bulging mass in vagina with straining. Integument/breast: negative for breast lump, breast tenderness, nipple discharge and skin lesion(s)    Objective:       BP 140/82  Pulse 73  Temp(Src) 98.5 F (36.9 C)  Ht 5\' 2"  (1.575 m)  Wt 140 lb (63.504 kg)  BMI 25.60 kg/m2 General:   alert  Skin:   no rash or abnormalities  Lungs:   clear to auscultation bilaterally  Heart:   regular rate and rhythm, S1, S2 normal, no murmur, click, rub or gallop  Breasts:   normal without suspicious masses, skin or nipple changes or axillary nodes  Abdomen:  normal findings: no organomegaly, soft, non-tender and no hernia  Pelvis:  External genitalia: normal general appearance Urinary system: Grade 3 Cystocele Vaginal: normal without tenderness, induration or masses Cervix: normal appearance Adnexa: normal bimanual exam Uterus: anteverted and non-tender, normal size   Lab Review Urine pregnancy test Labs reviewed yes Radiologic studies reviewed no    Assessment:    Healthy female exam.   Cystocele, grade 3, no urinary incontinence.   Plan:   Refer to Urogynecology for evaluation of cystocele.  Education  reviewed: calcium supplements, low fat, low cholesterol diet, safe sex/STD prevention, self breast exams and weight bearing exercise. Follow up in: 1 year.   No orders of the defined types were placed in this encounter.   Orders Placed This Encounter  Procedures  . WET PREP BY MOLECULAR PROBE

## 2013-10-24 LAB — PAP IG AND HPV HIGH-RISK: HPV DNA HIGH RISK: NOT DETECTED

## 2014-01-21 ENCOUNTER — Encounter: Payer: Self-pay | Admitting: Obstetrics

## 2014-09-25 ENCOUNTER — Other Ambulatory Visit (HOSPITAL_COMMUNITY): Payer: Self-pay | Admitting: Family Medicine

## 2014-09-25 DIAGNOSIS — Z1231 Encounter for screening mammogram for malignant neoplasm of breast: Secondary | ICD-10-CM

## 2014-09-26 ENCOUNTER — Other Ambulatory Visit (HOSPITAL_COMMUNITY): Payer: Self-pay | Admitting: Family Medicine

## 2014-09-26 DIAGNOSIS — M8580 Other specified disorders of bone density and structure, unspecified site: Secondary | ICD-10-CM

## 2014-10-07 ENCOUNTER — Ambulatory Visit (HOSPITAL_COMMUNITY): Payer: BC Managed Care – PPO

## 2014-10-07 ENCOUNTER — Other Ambulatory Visit (HOSPITAL_COMMUNITY): Payer: BC Managed Care – PPO

## 2014-10-09 DIAGNOSIS — H5213 Myopia, bilateral: Secondary | ICD-10-CM | POA: Diagnosis not present

## 2014-10-09 DIAGNOSIS — H40013 Open angle with borderline findings, low risk, bilateral: Secondary | ICD-10-CM | POA: Diagnosis not present

## 2014-10-28 ENCOUNTER — Ambulatory Visit: Payer: BC Managed Care – PPO | Admitting: Obstetrics

## 2014-10-28 ENCOUNTER — Telehealth: Payer: Self-pay | Admitting: Obstetrics

## 2014-10-29 ENCOUNTER — Encounter: Payer: Self-pay | Admitting: Obstetrics

## 2014-10-29 ENCOUNTER — Ambulatory Visit (INDEPENDENT_AMBULATORY_CARE_PROVIDER_SITE_OTHER): Payer: BC Managed Care – PPO | Admitting: Obstetrics

## 2014-10-29 VITALS — BP 125/77 | HR 68 | Temp 97.9°F | Ht 62.0 in | Wt 137.0 lb

## 2014-10-29 DIAGNOSIS — Z124 Encounter for screening for malignant neoplasm of cervix: Secondary | ICD-10-CM | POA: Diagnosis not present

## 2014-10-29 DIAGNOSIS — Z780 Asymptomatic menopausal state: Secondary | ICD-10-CM

## 2014-10-29 DIAGNOSIS — R399 Unspecified symptoms and signs involving the genitourinary system: Secondary | ICD-10-CM

## 2014-10-29 DIAGNOSIS — Z01419 Encounter for gynecological examination (general) (routine) without abnormal findings: Secondary | ICD-10-CM

## 2014-10-29 DIAGNOSIS — N8111 Cystocele, midline: Secondary | ICD-10-CM

## 2014-10-29 NOTE — Progress Notes (Signed)
Subjective:        Nicole Guerrero is a 64 y.o. female here for a routine exam.  Current complaints: none.    Personal health questionnaire:  Is patient Nicole Guerrero Jewish, have a family history of breast and/or ovarian cancer: no Is there a family history of uterine cancer diagnosed at age < 49, gastrointestinal cancer, urinary tract cancer, family member who is a Field seismologist syndrome-associated carrier: no Is the patient overweight and hypertensive, family history of diabetes, personal history of gestational diabetes, preeclampsia or PCOS: no Is patient over 69, have PCOS,  family history of premature CHD under age 77, diabetes, smoke, have hypertension or peripheral artery disease:  no At any time, has a partner hit, kicked or otherwise hurt or frightened you?: no Over the past 2 weeks, have you felt down, depressed or hopeless?: no Over the past 2 weeks, have you felt little interest or pleasure in doing things?:no   Gynecologic History No LMP recorded. Patient is postmenopausal. Contraception: post menopausal status Last Pap: 2015. Results were: normal Last mammogram: 2015. Results were: normal  Obstetric History OB History  Gravida Para Term Preterm AB SAB TAB Ectopic Multiple Living  3 3 3       3     # Outcome Date GA Lbr Len/2nd Weight Sex Delivery Anes PTL Lv  3 Term           2 Term           1 Term               Past Medical History  Diagnosis Date  . GERD (gastroesophageal reflux disease)     Past Surgical History  Procedure Laterality Date  . Tubal ligation       Current outpatient prescriptions:  .  Cholecalciferol (VITAMIN D) 2000 UNITS CAPS, Take 1 capsule by mouth every morning., Disp: , Rfl:  .  Multiple Vitamin (MULTIVITAMIN WITH MINERALS) TABS, Take 1 tablet by mouth every morning., Disp: , Rfl:  .  omeprazole (PRILOSEC OTC) 20 MG tablet, Take 20 mg by mouth daily., Disp: , Rfl:  No Known Allergies  History  Substance Use Topics  . Smoking status:  Former Smoker -- 0.03 packs/day for 6 years    Types: Cigarettes    Quit date: 03/22/1997  . Smokeless tobacco: Never Used  . Alcohol Use: No    Family History  Problem Relation Age of Onset  . Diabetes Other   . Cancer Other       Review of Systems  Constitutional: negative for fatigue and weight loss Respiratory: negative for cough and wheezing Cardiovascular: negative for chest pain, fatigue and palpitations Gastrointestinal: negative for abdominal pain and change in bowel habits Musculoskeletal:negative for myalgias Neurological: negative for gait problems and tremors Behavioral/Psych: negative for abusive relationship, depression Endocrine: negative for temperature intolerance   Genitourinary:negative for abnormal menstrual periods, genital lesions, hot flashes, sexual problems and vaginal discharge Integument/breast: negative for breast lump, breast tenderness, nipple discharge and skin lesion(s)    Objective:       BP 125/77 mmHg  Pulse 68  Temp(Src) 97.9 F (36.6 C)  Ht 5\' 2"  (1.575 m)  Wt 137 lb (62.143 kg)  BMI 25.05 kg/m2 General:   alert  Skin:   no rash or abnormalities  Lungs:   clear to auscultation bilaterally  Heart:   regular rate and rhythm, S1, S2 normal, no murmur, click, rub or gallop  Breasts:   normal without suspicious masses,  skin or nipple changes or axillary nodes  Abdomen:  normal findings: no organomegaly, soft, non-tender and no hernia  Pelvis:  External genitalia: normal general appearance Urinary system: urethral meatus normal and bladder without fullness, nontender Vaginal: normal without tenderness, induration or masses Cervix: normal appearance Adnexa: normal bimanual exam Uterus: anteverted and non-tender, normal size   Lab Review Urine pregnancy test Labs reviewed yes Radiologic studies reviewed yes    Assessment:    Healthy female exam.   Postmenopausal.  Doing well.  Mild cystocele.  Stable clinically.   UTI  symptoms  Plan:   Kegel exercises recommended Urine culture sent   Education reviewed: calcium supplements, self breast exams and weight bearing exercise. Mammogram ordered. Follow up in: 1 year.   No orders of the defined types were placed in this encounter.   Orders Placed This Encounter  Procedures  . Urine Culture  . SureSwab Bacterial Vaginosis/itis

## 2014-10-30 LAB — URINE CULTURE
Colony Count: NO GROWTH
Organism ID, Bacteria: NO GROWTH

## 2014-10-31 LAB — PAP IG AND HPV HIGH-RISK: HPV DNA HIGH RISK: NOT DETECTED

## 2014-11-01 LAB — SURESWAB BACTERIAL VAGINOSIS/ITIS
Atopobium vaginae: NOT DETECTED Log (cells/mL)
C. ALBICANS, DNA: NOT DETECTED
C. GLABRATA, DNA: NOT DETECTED
C. parapsilosis, DNA: NOT DETECTED
C. tropicalis, DNA: NOT DETECTED
Gardnerella vaginalis: NOT DETECTED Log (cells/mL)
LACTOBACILLUS SPECIES: NOT DETECTED Log (cells/mL)
MEGASPHAERA SPECIES: NOT DETECTED Log (cells/mL)
T. vaginalis RNA, QL TMA: NOT DETECTED

## 2014-11-01 NOTE — Telephone Encounter (Signed)
11/01/2014 - patient has scheduled annual appt. brm

## 2014-11-11 ENCOUNTER — Ambulatory Visit (HOSPITAL_COMMUNITY)
Admission: RE | Admit: 2014-11-11 | Discharge: 2014-11-11 | Disposition: A | Discharge status: Home / Self Care | Payer: BC Managed Care – PPO | Source: Ambulatory Visit | Attending: Family Medicine | Admitting: Family Medicine

## 2014-11-11 DIAGNOSIS — Z1231 Encounter for screening mammogram for malignant neoplasm of breast: Secondary | ICD-10-CM | POA: Insufficient documentation

## 2015-01-06 ENCOUNTER — Telehealth: Payer: Self-pay

## 2015-01-06 NOTE — Telephone Encounter (Signed)
Patient needed AVS mailed to her to verify her appt for annual in 10/29/14 - mailed to her on 01/06/15

## 2015-04-22 DIAGNOSIS — M5136 Other intervertebral disc degeneration, lumbar region: Secondary | ICD-10-CM | POA: Diagnosis not present

## 2015-04-22 DIAGNOSIS — Z6825 Body mass index (BMI) 25.0-25.9, adult: Secondary | ICD-10-CM | POA: Diagnosis not present

## 2015-04-22 DIAGNOSIS — Z1389 Encounter for screening for other disorder: Secondary | ICD-10-CM | POA: Diagnosis not present

## 2015-04-22 DIAGNOSIS — M545 Low back pain: Secondary | ICD-10-CM | POA: Diagnosis not present

## 2015-04-22 DIAGNOSIS — E663 Overweight: Secondary | ICD-10-CM | POA: Diagnosis not present

## 2015-05-05 ENCOUNTER — Other Ambulatory Visit: Payer: Self-pay | Admitting: Obstetrics

## 2015-05-05 DIAGNOSIS — N390 Urinary tract infection, site not specified: Secondary | ICD-10-CM

## 2015-05-05 MED ORDER — PHENAZOPYRIDINE HCL 200 MG PO TABS: 200.0000 mg | ORAL_TABLET | Freq: Three times a day (TID) | ORAL | Status: DC | PRN

## 2015-05-05 MED ORDER — NITROFURANTOIN MONOHYD MACRO 100 MG PO CAPS: 100.0000 mg | ORAL_CAPSULE | Freq: Two times a day (BID) | ORAL | Status: DC

## 2015-07-30 DIAGNOSIS — Z6825 Body mass index (BMI) 25.0-25.9, adult: Secondary | ICD-10-CM | POA: Diagnosis not present

## 2015-07-30 DIAGNOSIS — R7309 Other abnormal glucose: Secondary | ICD-10-CM | POA: Diagnosis not present

## 2015-07-30 DIAGNOSIS — Z1389 Encounter for screening for other disorder: Secondary | ICD-10-CM | POA: Diagnosis not present

## 2015-07-30 DIAGNOSIS — H6501 Acute serous otitis media, right ear: Secondary | ICD-10-CM | POA: Diagnosis not present

## 2015-10-13 DIAGNOSIS — H40012 Open angle with borderline findings, low risk, left eye: Secondary | ICD-10-CM | POA: Diagnosis not present

## 2015-10-13 DIAGNOSIS — H524 Presbyopia: Secondary | ICD-10-CM | POA: Diagnosis not present

## 2015-10-13 DIAGNOSIS — H40011 Open angle with borderline findings, low risk, right eye: Secondary | ICD-10-CM | POA: Diagnosis not present

## 2015-11-04 ENCOUNTER — Ambulatory Visit: Payer: BC Managed Care – PPO | Admitting: Obstetrics

## 2015-11-12 ENCOUNTER — Other Ambulatory Visit: Payer: Self-pay | Admitting: Obstetrics

## 2015-11-12 DIAGNOSIS — Z1231 Encounter for screening mammogram for malignant neoplasm of breast: Secondary | ICD-10-CM

## 2015-11-14 ENCOUNTER — Ambulatory Visit (HOSPITAL_COMMUNITY)
Admission: RE | Admit: 2015-11-14 | Discharge: 2015-11-14 | Disposition: A | Discharge status: Home / Self Care | Payer: Medicare Other | Source: Ambulatory Visit | Attending: Obstetrics | Admitting: Obstetrics

## 2015-11-14 DIAGNOSIS — Z1231 Encounter for screening mammogram for malignant neoplasm of breast: Secondary | ICD-10-CM | POA: Insufficient documentation

## 2015-11-18 ENCOUNTER — Ambulatory Visit (INDEPENDENT_AMBULATORY_CARE_PROVIDER_SITE_OTHER): Payer: Medicare Other | Admitting: Obstetrics

## 2015-11-18 ENCOUNTER — Encounter: Payer: Self-pay | Admitting: Obstetrics

## 2015-11-18 VITALS — BP 128/76 | HR 62 | Temp 99.0°F | Wt 128.6 lb

## 2015-11-18 DIAGNOSIS — Z01419 Encounter for gynecological examination (general) (routine) without abnormal findings: Secondary | ICD-10-CM

## 2015-11-18 DIAGNOSIS — Z124 Encounter for screening for malignant neoplasm of cervix: Secondary | ICD-10-CM | POA: Diagnosis not present

## 2015-11-18 DIAGNOSIS — Z780 Asymptomatic menopausal state: Secondary | ICD-10-CM

## 2015-11-18 DIAGNOSIS — N8111 Cystocele, midline: Secondary | ICD-10-CM

## 2015-11-18 NOTE — Addendum Note (Signed)
Addended by: Lewie Loron D on: 11/18/2015 04:01 PM   Modules accepted: Orders

## 2015-11-18 NOTE — Progress Notes (Signed)
Patient ID: Nicole Guerrero, female   DOB: 25-Aug-1950, 65 y.o.   MRN: [id number]   Subjective:        Nicole Guerrero is a 65 y.o. female here for a routine exam.  Current complaints: N0ne.    Personal health questionnaire:  Is patient [name], have a family history of breast and/or ovarian cancer: no Is there a family history of uterine cancer diagnosed at age < 26, gastrointestinal cancer, urinary tract cancer, family member who is a Field seismologist syndrome-associated carrier: no Is the patient overweight and hypertensive, family history of diabetes, personal history of gestational diabetes, preeclampsia or PCOS: no Is patient over 30, have PCOS,  family history of premature CHD under age 36, diabetes, smoke, have hypertension or peripheral artery disease:  no At any time, has a partner hit, kicked or otherwise hurt or frightened you?: no Over the past 2 weeks, have you felt down, depressed or hopeless?: no Over the past 2 weeks, have you felt little interest or pleasure in doing things?:no   Gynecologic History No LMP recorded. Patient is postmenopausal. Contraception: post menopausal status Last Pap: 2016. Results were: normal Last mammogram: 2017. Results were: normal  Obstetric History OB History  Gravida Para Term Preterm AB Living  3 3 3     3   SAB TAB Ectopic Multiple Live Births               # Outcome Date GA Lbr Len/2nd Weight Sex Delivery Anes PTL Lv  3 Term           2 Term           1 Term               Past Medical History:  Diagnosis Date  . GERD (gastroesophageal reflux disease)     Past Surgical History:  Procedure Laterality Date  . TUBAL LIGATION       Current Outpatient Prescriptions:  .  Cholecalciferol (VITAMIN D) 2000 UNITS CAPS, Take 1 capsule by mouth every morning., Disp: , Rfl:  .  Multiple Vitamin (MULTIVITAMIN WITH MINERALS) TABS, Take 1 tablet by mouth every morning., Disp: , Rfl:  .  nitrofurantoin, macrocrystal-monohydrate, (MACROBID)  100 MG capsule, Take 1 capsule (100 mg total) by mouth 2 (two) times daily., Disp: 14 capsule, Rfl: 2 .  omeprazole (PRILOSEC OTC) 20 MG tablet, Take 20 mg by mouth daily., Disp: , Rfl:  .  phenazopyridine (PYRIDIUM) 200 MG tablet, Take 1 tablet (200 mg total) by mouth 3 (three) times daily as needed for pain., Disp: 10 tablet, Rfl: 0 No Known Allergies  Social History  Substance Use Topics  . Smoking status: Former Smoker    Packs/day: 0.03    Years: 6.00    Types: Cigarettes    Quit date: 03/22/1997  . Smokeless tobacco: Never Used  . Alcohol use No    Family History  Problem Relation Age of Onset  . Diabetes Other   . Cancer Other   . Hypertension Other       Review of Systems  Constitutional: negative for fatigue and weight loss Respiratory: negative for cough and wheezing Cardiovascular: negative for chest pain, fatigue and palpitations Gastrointestinal: negative for abdominal pain and change in bowel habits Musculoskeletal:negative for myalgias Neurological: negative for gait problems and tremors Behavioral/Psych: negative for abusive relationship, depression Endocrine: negative for temperature intolerance   Genitourinary:negative for abnormal menstrual periods, genital lesions, hot flashes, sexual problems and vaginal discharge.  Urinary bladder  cystocele getting more uncomfortable, but still no incontinence Integument/breast: negative for breast lump, breast tenderness, nipple discharge and skin lesion(s)    Objective:       BP 128/76   Pulse 62   Temp 99 F (37.2 C)   Wt 128 lb 9.6 oz (58.3 kg)   BMI 23.52 kg/m  General:   alert  Skin:   no rash or abnormalities  Lungs:   clear to auscultation bilaterally  Heart:   regular rate and rhythm, S1, S2 normal, no murmur, click, rub or gallop  Breasts:   normal without suspicious masses, skin or nipple changes or axillary nodes  Abdomen:  normal findings: no organomegaly, soft, non-tender and no hernia  Pelvis:   External genitalia: normal general appearance Urinary system: urethral meatus normal and bladder without fullness, nontender Vaginal: normal without tenderness, induration or masses Cervix: normal appearance Adnexa: normal bimanual exam Uterus: anteverted and non-tender, normal size   Lab Review Urine pregnancy test Labs reviewed yes Radiologic studies reviewed yes  50% of 20 min visit spent on counseling and coordination of care.   Assessment:    Healthy female exam.    Plan:    Education reviewed: calcium supplements, low fat, low cholesterol diet, safe sex/STD prevention, self breast exams and weight bearing exercise. Follow up in: 1 year.   No orders of the defined types were placed in this encounter.  No orders of the defined types were placed in this encounter.

## 2015-11-18 NOTE — Addendum Note (Signed)
Addended by: Lewie Loron D on: 11/18/2015 04:44 PM   Modules accepted: Orders

## 2015-11-20 LAB — PAP IG (IMAGE GUIDED): PAP Smear Comment: 0

## 2015-11-21 LAB — NUSWAB BV AND CANDIDA, NAA
Candida albicans, NAA: NEGATIVE
Candida glabrata, NAA: NEGATIVE

## 2016-01-07 DIAGNOSIS — R8290 Unspecified abnormal findings in urine: Secondary | ICD-10-CM | POA: Diagnosis not present

## 2016-01-07 DIAGNOSIS — N8111 Cystocele, midline: Secondary | ICD-10-CM | POA: Diagnosis not present

## 2016-01-07 DIAGNOSIS — N814 Uterovaginal prolapse, unspecified: Secondary | ICD-10-CM | POA: Diagnosis not present

## 2016-08-12 ENCOUNTER — Other Ambulatory Visit: Payer: Self-pay | Admitting: Obstetrics

## 2016-08-12 ENCOUNTER — Telehealth: Payer: Self-pay

## 2016-08-12 DIAGNOSIS — N390 Urinary tract infection, site not specified: Secondary | ICD-10-CM

## 2016-08-12 NOTE — Telephone Encounter (Signed)
Pt called and requested a refill for a bladder infection at 10:53. Called pt back at 11:11 am. Pt complains of having burning with urination, and bladder pressure. Pt looked at her last bottle and states that she has a refill on the macrobid. Pt will contact her pharmacy

## 2016-08-12 NOTE — Telephone Encounter (Signed)
Pt called back, and stated that she called the pharmacy and was told that she would need another rx sent in for the [place] because her previous rx had expired. She is complaining of burning with urination, and bladder pressure. She is requesting treatment for a UTI

## 2016-10-14 ENCOUNTER — Other Ambulatory Visit (HOSPITAL_COMMUNITY): Payer: Self-pay | Admitting: Family Medicine

## 2016-10-14 ENCOUNTER — Ambulatory Visit (HOSPITAL_COMMUNITY)
Admission: RE | Admit: 2016-10-14 | Discharge: 2016-10-14 | Disposition: A | Discharge status: Home / Self Care | Payer: Medicare Other | Source: Ambulatory Visit | Attending: Family Medicine | Admitting: Family Medicine

## 2016-10-14 DIAGNOSIS — M25561 Pain in right knee: Secondary | ICD-10-CM

## 2016-10-14 DIAGNOSIS — M1711 Unilateral primary osteoarthritis, right knee: Secondary | ICD-10-CM | POA: Insufficient documentation

## 2016-12-01 ENCOUNTER — Ambulatory Visit (INDEPENDENT_AMBULATORY_CARE_PROVIDER_SITE_OTHER): Payer: Medicare Other | Admitting: Orthopedic Surgery

## 2016-12-01 VITALS — BP 132/87 | HR 75 | Wt 136.0 lb

## 2016-12-01 DIAGNOSIS — M1711 Unilateral primary osteoarthritis, right knee: Secondary | ICD-10-CM | POA: Diagnosis not present

## 2016-12-01 NOTE — Progress Notes (Signed)
Patient ID: Nicole Guerrero, female   DOB: [birth year], 66 y.o.   MRN: [id number]  Chief Complaint  Patient presents with  . Knee Pain    right    This is a 66 year old female with a chronic history of pain in her right knee she had acute episode of pain which was dull aching seemed to be throughout the entire knee for several days which eventually resolved with time rest ice and ibuprofen. X-ray showed arthritis of the patellofemoral joint and primarily medial hemi-joint    Review of Systems  Gastrointestinal: Positive for heartburn.  Musculoskeletal: Positive for myalgias and neck pain.  Neurological: Negative for tremors and focal weakness.    Past Medical History:  Diagnosis Date  . GERD (gastroesophageal reflux disease)     Past Surgical History:  Procedure Laterality Date  . TUBAL LIGATION      PHYSICAL EXAM  BP 132/87   Pulse 75   Wt 136 lb (61.7 kg)   BMI 24.87 kg/m  GENERAL appearance reveals no gross abnormalities, normal development grooming and hygiene   MENTAL STATUS we note that the patient is awake alert and oriented to person place and time MOOD/AFFECT ARE NORMAL   GAIT reveals No noticeable limp in the effected limb  Left Knee Exam   Tenderness  None  Range of Motion  Normal left knee ROM  Tests  Drawer:       Anterior - Negative       Right Knee Exam   Tenderness  None  Range of Motion  Normal right knee ROM  Muscle Strength  Normal right knee strength  Tests  Drawer:       Anterior - Negative        VASC 2+ dorsalis pedis pulse normal capillary refill excellent warmth to the extremity  NEURO normal sensation and no pathologic reflexes  LYMPH deferred noncontributory   IMAGING STUDIES  I have independently reviewed the x-rays and I interpreted the x-rays as follows:  Her x-rays were done at the hospital she has medial compartment joint space narrowing minimal sclerosis no osteophytes no cysts. The knee tends to be heading  towards varus  Dx   primary osteoarthritis of the  right knee  PLAN  symptomatic treatment call if gets worse  4:11 PM Arther Abbott, MD 12/01/2016

## 2017-02-11 ENCOUNTER — Other Ambulatory Visit: Payer: Self-pay | Admitting: Obstetrics

## 2017-02-11 DIAGNOSIS — Z1231 Encounter for screening mammogram for malignant neoplasm of breast: Secondary | ICD-10-CM

## 2017-02-14 ENCOUNTER — Ambulatory Visit (HOSPITAL_COMMUNITY)
Admission: RE | Admit: 2017-02-14 | Discharge: 2017-02-14 | Disposition: A | Discharge status: Home / Self Care | Payer: Medicare Other | Source: Ambulatory Visit | Attending: Obstetrics | Admitting: Obstetrics

## 2017-02-14 DIAGNOSIS — Z1231 Encounter for screening mammogram for malignant neoplasm of breast: Secondary | ICD-10-CM | POA: Diagnosis not present

## 2017-06-16 ENCOUNTER — Encounter (HOSPITAL_COMMUNITY): Payer: Self-pay | Admitting: Emergency Medicine

## 2017-06-16 ENCOUNTER — Emergency Department (HOSPITAL_COMMUNITY): Payer: Medicare Other

## 2017-06-16 ENCOUNTER — Emergency Department (HOSPITAL_COMMUNITY)
Admission: EM | Admit: 2017-06-16 | Discharge: 2017-06-16 | Disposition: A | Discharge status: Home / Self Care | Payer: Medicare Other | Attending: Emergency Medicine | Admitting: Emergency Medicine

## 2017-06-16 DIAGNOSIS — Z5321 Procedure and treatment not carried out due to patient leaving prior to being seen by health care provider: Secondary | ICD-10-CM | POA: Insufficient documentation

## 2017-06-16 DIAGNOSIS — R079 Chest pain, unspecified: Secondary | ICD-10-CM | POA: Insufficient documentation

## 2017-06-16 HISTORY — DX: Viral endocarditis: B33.21

## 2017-06-16 LAB — CBC
HEMATOCRIT: 38.5 % (ref 36.0–46.0)
HEMOGLOBIN: 11.8 g/dL — AB (ref 12.0–15.0)
MCH: 28.6 pg (ref 26.0–34.0)
MCHC: 30.6 g/dL (ref 30.0–36.0)
MCV: 93.2 fL (ref 78.0–100.0)
Platelets: 397 10*3/uL (ref 150–400)
RBC: 4.13 MIL/uL (ref 3.87–5.11)
RDW: 13.8 % (ref 11.5–15.5)
WBC: 7.7 10*3/uL (ref 4.0–10.5)

## 2017-06-16 LAB — BASIC METABOLIC PANEL
ANION GAP: 10 (ref 5–15)
BUN: 12 mg/dL (ref 6–20)
CALCIUM: 9.6 mg/dL (ref 8.9–10.3)
CO2: 27 mmol/L (ref 22–32)
Chloride: 99 mmol/L — ABNORMAL LOW (ref 101–111)
Creatinine, Ser: 0.87 mg/dL (ref 0.44–1.00)
GFR calc non Af Amer: >60 mL/min (ref >60)
GLUCOSE: 117 mg/dL — AB (ref 65–99)
POTASSIUM: 3.7 mmol/L (ref 3.5–5.1)
Sodium: 136 mmol/L (ref 135–145)

## 2017-06-16 LAB — TROPONIN I

## 2017-06-16 NOTE — ED Triage Notes (Signed)
Pt reports chest pain starting this morning.  2 baby aspirin and Tylenol x 2 taken this morning.  Pain in left arm and back.  Hurts to raise left arm with no injury reported.

## 2017-06-16 NOTE — ED Notes (Signed)
RN came to waiting room to get patient to put in room. Staff member saw patient leave before being able to be put into a room.

## 2017-10-10 ENCOUNTER — Other Ambulatory Visit (HOSPITAL_COMMUNITY): Payer: Self-pay | Admitting: Internal Medicine

## 2017-10-10 DIAGNOSIS — M899 Disorder of bone, unspecified: Secondary | ICD-10-CM

## 2017-10-19 ENCOUNTER — Ambulatory Visit (HOSPITAL_COMMUNITY)
Admission: RE | Admit: 2017-10-19 | Discharge: 2017-10-19 | Disposition: A | Discharge status: Home / Self Care | Payer: Medicare Other | Source: Ambulatory Visit | Attending: Internal Medicine | Admitting: Internal Medicine

## 2017-10-19 DIAGNOSIS — M8588 Other specified disorders of bone density and structure, other site: Secondary | ICD-10-CM | POA: Diagnosis not present

## 2017-10-19 DIAGNOSIS — Z780 Asymptomatic menopausal state: Secondary | ICD-10-CM | POA: Insufficient documentation

## 2017-10-19 DIAGNOSIS — M85851 Other specified disorders of bone density and structure, right thigh: Secondary | ICD-10-CM | POA: Insufficient documentation

## 2017-10-19 DIAGNOSIS — M899 Disorder of bone, unspecified: Secondary | ICD-10-CM | POA: Diagnosis present

## 2017-10-20 ENCOUNTER — Other Ambulatory Visit (HOSPITAL_COMMUNITY): Payer: Self-pay | Admitting: Neurology

## 2017-10-20 DIAGNOSIS — G8190 Hemiplegia, unspecified affecting unspecified side: Secondary | ICD-10-CM

## 2017-10-20 DIAGNOSIS — R42 Dizziness and giddiness: Secondary | ICD-10-CM

## 2017-10-20 DIAGNOSIS — R269 Unspecified abnormalities of gait and mobility: Secondary | ICD-10-CM

## 2017-10-28 ENCOUNTER — Ambulatory Visit (HOSPITAL_COMMUNITY)
Admission: RE | Admit: 2017-10-28 | Discharge: 2017-10-28 | Disposition: A | Discharge status: Home / Self Care | Payer: Medicare Other | Source: Ambulatory Visit | Attending: Neurology | Admitting: Neurology

## 2017-10-28 DIAGNOSIS — G8190 Hemiplegia, unspecified affecting unspecified side: Secondary | ICD-10-CM

## 2017-10-28 DIAGNOSIS — R42 Dizziness and giddiness: Secondary | ICD-10-CM

## 2017-10-28 DIAGNOSIS — G9589 Other specified diseases of spinal cord: Secondary | ICD-10-CM | POA: Insufficient documentation

## 2017-10-28 DIAGNOSIS — R269 Unspecified abnormalities of gait and mobility: Secondary | ICD-10-CM

## 2017-10-28 LAB — POCT I-STAT CREATININE: Creatinine, Ser: 0.8 mg/dL (ref 0.44–1.00)

## 2017-10-28 LAB — CG4 I-STAT (LACTIC ACID): Lactic Acid, Venous: 0.66 mmol/L (ref 0.5–1.9)

## 2017-10-28 MED ORDER — GADOBENATE DIMEGLUMINE 529 MG/ML IV SOLN
12.0000 mL | Freq: Once | INTRAVENOUS | Status: AC | PRN
  Administered 2017-10-28: 12 mL via INTRAVENOUS

## 2017-12-09 ENCOUNTER — Other Ambulatory Visit: Payer: Self-pay | Admitting: Obstetrics

## 2017-12-12 ENCOUNTER — Encounter (HOSPITAL_COMMUNITY): Payer: Self-pay

## 2017-12-12 ENCOUNTER — Encounter (HOSPITAL_COMMUNITY)
Admission: RE | Admit: 2017-12-12 | Discharge: 2017-12-12 | Disposition: A | Discharge status: Home / Self Care | Payer: Medicare Other | Source: Ambulatory Visit | Attending: Neurology | Admitting: Neurology

## 2017-12-12 DIAGNOSIS — M488X2 Other specified spondylopathies, cervical region: Secondary | ICD-10-CM | POA: Diagnosis present

## 2017-12-12 DIAGNOSIS — R531 Weakness: Secondary | ICD-10-CM | POA: Diagnosis present

## 2017-12-12 MED ORDER — SODIUM CHLORIDE 0.9 % IV SOLN
1000.0000 mg | Freq: Every day | INTRAVENOUS | Status: DC
  Administered 2017-12-12: 1000 mg via INTRAVENOUS
  Filled 2017-12-12 (×2): qty 8

## 2017-12-12 MED ORDER — SODIUM CHLORIDE 0.9 % IV SOLN
Freq: Every day | INTRAVENOUS | Status: DC
  Administered 2017-12-12: 08:00:00 via INTRAVENOUS

## 2017-12-12 NOTE — Progress Notes (Signed)
Patient received 1/3 Solumedrol infusions without s/s of reaction.  Written material provided and discussed.  Patient and family verbalized understanding.

## 2017-12-12 NOTE — Discharge Instructions (Signed)
Methylprednisolone Solution for Injection What is this medicine? METHYLPREDNISOLONE (meth ill pred NISS oh lone) is a corticosteroid. It is commonly used to treat inflammation of the skin, joints, lungs, and other organs. Common conditions treated include asthma, allergies, and arthritis. It is also used for other conditions, such as blood disorders and diseases of the adrenal glands. This medicine may be used for other purposes; ask your health care provider or pharmacist if you have questions. COMMON BRAND NAME(S): A-Methapred, Solu-Medrol What should I tell my health care provider before I take this medicine? They need to know if you have any of these conditions: -Cushing's syndrome -eye disease, vision problems -diabetes -glaucoma -heart disease -high blood pressure -infection (especially a virus infection such as chickenpox, cold sores, or herpes) -liver disease -mental illness -myasthenia gravis -osteoporosis -recently received or scheduled to receive a vaccine -seizures -stomach or intestine problems -thyroid disease -an unusual or allergic reaction to lactose, methylprednisolone, other medicines, foods, dyes, or preservatives -pregnant or trying to get pregnant -breast-feeding How should I use this medicine? This medicine is for injection or infusion into a vein. It is also for injection into a muscle. It is given by a health care professional in a hospital or clinic setting. Talk to your pediatrician regarding the use of this medicine in children. While this drug may be prescribed for selected conditions, precautions do apply. Overdosage: If you think you have taken too much of this medicine contact a poison control center or emergency room at once. NOTE: This medicine is only for you. Do not share this medicine with others. What if I miss a dose? This does not apply. What may interact with this medicine? Do not take this medicine with any of the following  medications: -alefacept -echinacea -iopamidol -live virus vaccines -metyrapone -mifepristone This medicine may also interact with the following medications: -amphotericin B -aspirin and aspirin-like medicines -certain antibiotics like erythromycin, clarithromycin, troleandomycin -certain medicines for diabetes -certain medicines for fungal infection like ketoconazole -certain medicines for seizures like carbamazepine, phenobarbital, phenytoin -certain medicines that treat or prevent blood clots like warfarin -cyclosporine -digoxin -diuretics -female hormones, like estrogens and birth control pills -isoniazid -NSAIDS, medicines for pain and inflammation, like ibuprofen or naproxen -other medicines for myasthenia gravis -rifampin -vaccines This list may not describe all possible interactions. Give your health care provider a list of all the medicines, herbs, non-prescription drugs, or dietary supplements you use. Also tell them if you smoke, drink alcohol, or use illegal drugs. Some items may interact with your medicine. What should I watch for while using this medicine? Tell your doctor or healthcare professional if your symptoms do not start to get better or if they get worse. Do not stop taking except on your doctor's advice. You may develop a severe reaction. Your doctor will tell you how much medicine to take. Your condition will be monitored carefully while you are receiving this medicine. This medicine may increase your risk of getting an infection. Tell your doctor or health care professional if you are around anyone with measles or chickenpox, or if you develop sores or blisters that do not heal properly. This medicine may affect blood sugar levels. If you have diabetes, check with your doctor or health care professional before you change your diet or the dose of your diabetic medicine. Tell your doctor or health care professional right away if you have any change in your  eyesight. Using this medicine for a long time may increase your risk of low bone  mass. Talk to your doctor about bone health. °What side effects may I notice from receiving this medicine? °Side effects that you should report to your doctor or health care professional as soon as possible: °-allergic reactions like skin rash, itching or hives, swelling of the face, lips, or tongue °-bloody or tarry stools °-changes in vision °-hallucination, loss of contact with reality °-muscle cramps °-muscle pain °-palpitations °-signs and symptoms of high blood sugar such as dizziness; dry mouth; dry skin; fruity breath; nausea; stomach pain; increased hunger or thirst; increased urination °-signs and symptoms of infection like fever or chills; cough; sore throat; pain or trouble passing urine °-trouble passing urine or change in the amount of urine °Side effects that usually do not require medical attention (report to your doctor or health care professional if they continue or are bothersome): °-changes in emotions or mood °-constipation °-diarrhea °-excessive hair growth on the face or body °-headache °-nausea, vomiting °-pain, redness, or irritation at site where injected °-trouble sleeping °-weight gain °This list may not describe all possible side effects. Call your doctor for medical advice about side effects. You may report side effects to FDA at 1-800-FDA-1088. °Where should I keep my medicine? °This drug is given in a hospital or clinic and will not be stored at home. °NOTE: This sheet is a summary. It may not cover all possible information. If you have questions about this medicine, talk to your doctor, pharmacist, or health care provider. °© 2018 [employer]vier/Gold Standard (2015-05-15 16:21:28) ° °

## 2017-12-13 ENCOUNTER — Encounter (HOSPITAL_COMMUNITY)
Admission: RE | Admit: 2017-12-13 | Discharge: 2017-12-13 | Disposition: A | Discharge status: Home / Self Care | Payer: Medicare Other | Source: Ambulatory Visit | Attending: Neurology | Admitting: Neurology

## 2017-12-13 ENCOUNTER — Encounter (HOSPITAL_COMMUNITY): Payer: Self-pay

## 2017-12-13 DIAGNOSIS — R531 Weakness: Secondary | ICD-10-CM | POA: Diagnosis not present

## 2017-12-13 MED ORDER — SODIUM CHLORIDE 0.9 % IV SOLN
1000.0000 mg | Freq: Once | INTRAVENOUS | Status: AC
  Administered 2017-12-14: 1000 mg via INTRAVENOUS
  Filled 2017-12-13: qty 8

## 2017-12-13 MED ORDER — SODIUM CHLORIDE 0.9 % IV SOLN
1000.0000 mg | Freq: Once | INTRAVENOUS | Status: AC
  Administered 2017-12-13: 1000 mg via INTRAVENOUS
  Filled 2017-12-13: qty 8

## 2017-12-13 MED ORDER — SODIUM CHLORIDE 0.9 % IV SOLN
Freq: Once | INTRAVENOUS | Status: AC
  Administered 2017-12-13: 07:00:00 via INTRAVENOUS

## 2017-12-14 ENCOUNTER — Encounter (HOSPITAL_COMMUNITY)
Admission: RE | Admit: 2017-12-14 | Discharge: 2017-12-14 | Disposition: A | Discharge status: Home / Self Care | Payer: Medicare Other | Source: Ambulatory Visit | Attending: Neurology | Admitting: Neurology

## 2017-12-14 DIAGNOSIS — R531 Weakness: Secondary | ICD-10-CM | POA: Diagnosis not present

## 2018-01-11 ENCOUNTER — Ambulatory Visit: Payer: Medicare Other | Admitting: Cardiovascular Disease

## 2018-02-28 ENCOUNTER — Other Ambulatory Visit: Payer: Self-pay | Admitting: Obstetrics

## 2018-02-28 DIAGNOSIS — Z1231 Encounter for screening mammogram for malignant neoplasm of breast: Secondary | ICD-10-CM

## 2018-03-02 ENCOUNTER — Ambulatory Visit (HOSPITAL_COMMUNITY)
Admission: RE | Admit: 2018-03-02 | Discharge: 2018-03-02 | Disposition: A | Discharge status: Home / Self Care | Payer: Medicare Other | Source: Ambulatory Visit | Attending: Obstetrics | Admitting: Obstetrics

## 2018-03-02 DIAGNOSIS — Z1231 Encounter for screening mammogram for malignant neoplasm of breast: Secondary | ICD-10-CM | POA: Diagnosis present

## 2018-03-03 ENCOUNTER — Ambulatory Visit (HOSPITAL_COMMUNITY): Payer: Medicare Other

## 2018-04-24 ENCOUNTER — Encounter: Payer: Self-pay | Admitting: Neurology

## 2018-07-04 ENCOUNTER — Ambulatory Visit: Payer: Medicare Other | Admitting: Neurology

## 2018-09-04 NOTE — Progress Notes (Signed)
NEUROLOGY CONSULTATION NOTE  Nicole Guerrero MRN: [id number] DOB: 1951/03/08  Referring provider: Wandra Feinstein, MD Primary care provider: Wandra Feinstein, MD  Reason for consult:  Lower extremity weakness  HISTORY OF PRESENT ILLNESS: Nicole Guerrero is a 68 year old right-handed African American woman with GERD and remote history of viral endocarditis who presents for lower extremity weakness.  Recent history supplemented by referring provider note.  She is suffered from right-sided neck pain since at least 2013. The pain radiates on the right, from the shoulder up to the right side of her occipital region. It is exacerbated with movement of her head to the right. There is no shooting pain down the arm nor numbness involving the arms. She denies any numbness in the legs. She did report cramping on the right leg and right arm, which has since resolved after starting vitamin D supplements 4 vitamin D deficiency. Occasionally, she notes soreness in the right hip and her right leg as a sensation that it's a little weaker. She was treated by a local neurologist in [facility] about a couple of years ago. She reports an MRI of the brain at that time, but results are not available. She had undergone physical therapy which helped. More recently, the neck pain got worse. She had an MRI of the cervical spine without contrast performed on 06/05/13, which showed hyperintensity noted in the right lateral cord at the C5-6 level, measuring 5 mm on axial and 18 mm on sagittal images, not present on prior scan from 04/25/09.  Mild to moderate stenosis is noted at that level as well. She was referred to Dr. Hal Neer of neurosurgery, who thought evaluation by neurology was more appropriate.  I saw her once in consultation on 08/06/13.  At that time, I asked her to obtain her previous MRIs on CD and bring to me for review.  She underwent blood work.  B12 was 690, Lyme antibodies negative.  ANA was borderline positive 1:40  with speckled pattern.  Sed rate 8, RF negative and ENA 9 panel (ds DNA ab, ribosomal P Protein ab, SSA/SSB antibodies, centromere ab, ENA SM ab Ser-aCnc, SM/RNP, Scl-70 ab and anti-Jo) was negative.  Plan was to have her follow up in 4 weeks and proceed with LP but she never brought her MRIs and never followed up.  She subsequently followed up with neurologist Dr. Merlene Laughter in 2019 for continued right sided weakness.  MRI of brain with and without contrast performed on 10/28/17 was personally reviewed and was normal. MRI of cervical spine with and without contrast performed on same day was personally reviewed and demonstrates far righ T2 cord signal abnormalmity at C5 and C6 extending 1.7 cm without enhancement (stable compared to prior imaging from March 2015) as well as subtle posterior T2 signal changes at T1 cord level. With contrast, findings more consistent with chronic demyelination or malacia and not neoplasm.  She subsequently underwent 3 day course of SoluMedrol in September 2019 which was ineffective.  Dr. Merlene Laughter recommended blood work and lumbar puncture for CSF analysis but again patient never followed up to have these tests performed.  Over the past month, she reports worsening right sided weakness and polyarthralgia including the right knee, right hip and both shoulders.  She was found to have osteoarthritis in the right knee.  She denies history of head or neck injury.  She states that her niece has multiple sclerosis.  PAST MEDICAL HISTORY: Past Medical History:  Diagnosis Date   GERD (  gastroesophageal reflux disease)    Viral endocarditis     PAST SURGICAL HISTORY: Past Surgical History:  Procedure Laterality Date   TUBAL LIGATION      MEDICATIONS: Current Outpatient Medications on File Prior to Visit  Medication Sig Dispense Refill   Cholecalciferol (VITAMIN D) 2000 UNITS CAPS Take 1 capsule by mouth every morning.     Multiple Vitamin (MULTIVITAMIN WITH MINERALS) TABS  Take 1 tablet by mouth every morning.     nitrofurantoin, macrocrystal-monohydrate, (MACROBID) 100 MG capsule TAKE ONE CAPSULE BY MOUTH TWICE A DAY 14 capsule 1   omeprazole (PRILOSEC OTC) 20 MG tablet Take 20 mg by mouth daily.     No current facility-administered medications on file prior to visit.     ALLERGIES: No Known Allergies  FAMILY HISTORY: Family History  Problem Relation Age of Onset   Diabetes Other    Cancer Other    Hypertension Other    SOCIAL HISTORY: Social History   Socioeconomic History   Marital status: Divorced    Spouse name: Not on file   Number of children: Not on file   Years of education: Not on file   Highest education level: Not on file  Occupational History   Not on file  Social Needs   Financial resource strain: Not on file   Food insecurity    Worry: Not on file    Inability: Not on file   Transportation needs    Medical: Not on file    Non-medical: Not on file  Tobacco Use   Smoking status: Former Smoker    Packs/day: 0.03    Years: 6.00    Pack years: 0.18    Types: Cigarettes    Quit date: 03/22/1997    Years since quitting: 21.4   Smokeless tobacco: Never Used  Substance and Sexual Activity   Alcohol use: No   Drug use: No   Sexual activity: Not Currently  Lifestyle   Physical activity    Days per week: Not on file    Minutes per session: Not on file   Stress: Not on file  Relationships   Social connections    Talks on phone: Not on file    Gets together: Not on file    Attends religious service: Not on file    Active member of club or organization: Not on file    Attends meetings of clubs or organizations: Not on file    Relationship status: Not on file   Intimate partner violence    Fear of current or ex partner: Not on file    Emotionally abused: Not on file    Physically abused: Not on file    Forced sexual activity: Not on file  Other Topics Concern   Not on file  Social History  Narrative   Not on file    REVIEW OF SYSTEMS: Constitutional: No fevers, chills, or sweats, no generalized fatigue, change in appetite Eyes: No visual changes, double vision, eye pain Ear, nose and throat: No hearing loss, ear pain, nasal congestion, sore throat Cardiovascular: No chest pain, palpitations Respiratory:  No shortness of breath at rest or with exertion, wheezes GastrointestinaI: No nausea, vomiting, diarrhea, abdominal pain, fecal incontinence Genitourinary:  No dysuria, urinary retention or frequency Musculoskeletal:  No neck pain, back pain Integumentary: No rash, pruritus, skin lesions Neurological: as above Psychiatric: No depression, insomnia, anxiety Endocrine: No palpitations, fatigue, diaphoresis, mood swings, change in appetite, change in weight, increased thirst Hematologic/Lymphatic:  No purpura,  petechiae. Allergic/Immunologic: no itchy/runny eyes, nasal congestion, recent allergic reactions, rashes  PHYSICAL EXAM: Blood pressure 107/63, pulse 77, height 5' 1.75" (1.568 m), weight 135 lb (61.2 kg), SpO2 98 %. General: No acute distress.  Patient appears well-groomed.  Head:  Normocephalic/atraumatic Eyes:  fundi examined but not visualized Neck: supple, no paraspinal tenderness, full range of motion Back: No paraspinal tenderness Heart: regular rate and rhythm Lungs: Clear to auscultation bilaterally. Vascular: No carotid bruits. Neurological Exam: Mental status: alert and oriented to person, place, and time, recent and remote memory intact, fund of knowledge intact, attention and concentration intact, speech fluent and not dysarthric, language intact. Cranial nerves: CN I: not tested CN II: pupils equal, round and reactive to light, visual fields intact CN III, IV, VI:  full range of motion, no nystagmus, no ptosis CN V: facial sensation intact CN VII: upper and lower face symmetric CN VIII: hearing intact CN IX, X: gag intact, uvula midline CN XI:  sternocleidomastoid and trapezius muscles intact CN XII: tongue midline Bulk & Tone: normal, no fasciculations. Motor:  4-/5 right lower extremity.  Otherwise 5/5 throughout Sensation:  Pinprick and vibration sensation intact. Deep Tendon Reflexes:  2+ throughout, toes downgoing.  Finger to nose testing:  Without dysmetria.  Heel to shin:  Without dysmetria.  Gait:  Mildly wide based antalgic gait.  Able to turn and tandem walk. Romberg negative.  IMPRESSION: Right lower extremity weakness.  Abnormal cervical spinal cord signal on MRI, thought to be chronic demyelination or malacia.  Now with worsening right sided weakness.  She also complains of diffuse pain so it also may be related to arthritis.   PLAN: 1.  We will check MRI of brain/cervical/thoracic/lumbosacral spine with and without contrast to evaluate worsening right leg weakness and progression of MRI changes. 2.  Check blood work:  ANA, Sed Rate, Lyme, NMO antibody 3.  After MRI, will send for LP for CSF analysis of cell count, protein, glucose, oligoclonal bands, IgG index, Lyme, gram stain and culture. 4.  Refer to physical therapy for right leg weakness and gait. 5.  Follow up after testing.  Thank you for allowing me to take part in the care of this patient.  Metta Clines, DO

## 2018-09-05 ENCOUNTER — Encounter: Payer: Self-pay | Admitting: Neurology

## 2018-09-05 ENCOUNTER — Other Ambulatory Visit: Payer: Self-pay

## 2018-09-05 ENCOUNTER — Ambulatory Visit (INDEPENDENT_AMBULATORY_CARE_PROVIDER_SITE_OTHER): Payer: Medicare Other | Admitting: Neurology

## 2018-09-05 ENCOUNTER — Telehealth: Payer: Self-pay

## 2018-09-05 ENCOUNTER — Other Ambulatory Visit (INDEPENDENT_AMBULATORY_CARE_PROVIDER_SITE_OTHER): Payer: Medicare Other

## 2018-09-05 VITALS — BP 107/63 | HR 77 | Ht 61.75 in | Wt 135.0 lb

## 2018-09-05 DIAGNOSIS — M542 Cervicalgia: Secondary | ICD-10-CM | POA: Diagnosis not present

## 2018-09-05 DIAGNOSIS — M47812 Spondylosis without myelopathy or radiculopathy, cervical region: Secondary | ICD-10-CM

## 2018-09-05 DIAGNOSIS — R6889 Other general symptoms and signs: Secondary | ICD-10-CM

## 2018-09-05 DIAGNOSIS — R29898 Other symptoms and signs involving the musculoskeletal system: Secondary | ICD-10-CM

## 2018-09-05 DIAGNOSIS — N889 Noninflammatory disorder of cervix uteri, unspecified: Secondary | ICD-10-CM | POA: Diagnosis not present

## 2018-09-05 LAB — SEDIMENTATION RATE: Sed Rate: 23 mm/hr (ref 0–30)

## 2018-09-05 NOTE — Telephone Encounter (Signed)
Called Nicole Guerrero at [facility] advising of LP orders, that they need to be scheduled after MRI's, and that there are additional labs in scheduling comments.

## 2018-09-05 NOTE — Patient Instructions (Addendum)
1.  MRI of brain, cervical, thoracic and lumbosacral spine with and without contrast 2.  Check blood work:  ANA, Sed Rate, Lyme,  NMO antibody 3.  After MRI, will send for spinal tap checking CSF cell count, protein, glucose, oligoclonal bands, IgG index, Lyme, gram stain and culture. 4.  Follow up after testing.  Your provider has requested that you have labwork completed today. Please go to [facility] ([id number]) on the second floor of this building before leaving the office today. You do not need to check in. If you are not called within 15 minutes please check with the front desk.   We have sent a referral to [facility] for your MRI's and they will call you directly to schedule your appointment. They are located at [place]. If you need to contact them directly please call [phone].

## 2018-09-07 ENCOUNTER — Ambulatory Visit: Payer: Medicare Other | Admitting: Neurology

## 2018-09-07 LAB — LYME AB/WESTERN BLOT REFLEX
LYME DISEASE AB, QUANT, IGM: <0.8 index (ref 0.00–0.79)
Lyme IgG/IgM Ab: <0.91 {ISR} (ref 0.00–0.90)

## 2018-09-07 LAB — LYME DISEASE ABS IGG, IGM, IFA, CSF

## 2018-09-07 LAB — ANA: Anti Nuclear Antibody (ANA): NEGATIVE

## 2018-09-08 LAB — NEUROMYELITIS OPTICA AUTOAB, IGG: NMO IgG Autoantibodies: <1.5 U/mL (ref 0.0–3.0)

## 2018-09-12 ENCOUNTER — Telehealth: Payer: Self-pay

## 2018-09-12 NOTE — Telephone Encounter (Signed)
Left message informing patient of normal blood work.

## 2018-09-12 NOTE — Telephone Encounter (Signed)
Left message for patient to call back to inform her of normal blood results.

## 2018-09-12 NOTE — Telephone Encounter (Signed)
Patient was calling you back. She said to leave detailed msg if you don't get her. Thanks!

## 2018-09-12 NOTE — Telephone Encounter (Signed)
-----   Message from Pieter Partridge, DO sent at 09/11/2018  6:36 AM EDT ----- All blood work is normal

## 2018-09-17 ENCOUNTER — Other Ambulatory Visit: Payer: Self-pay | Admitting: Obstetrics

## 2018-09-18 ENCOUNTER — Encounter (HOSPITAL_COMMUNITY): Payer: Self-pay

## 2018-09-18 ENCOUNTER — Ambulatory Visit (HOSPITAL_COMMUNITY): Payer: Medicare Other | Attending: Neurology

## 2018-09-18 ENCOUNTER — Other Ambulatory Visit: Payer: Self-pay

## 2018-09-18 DIAGNOSIS — M6281 Muscle weakness (generalized): Secondary | ICD-10-CM | POA: Insufficient documentation

## 2018-09-18 DIAGNOSIS — R2689 Other abnormalities of gait and mobility: Secondary | ICD-10-CM | POA: Insufficient documentation

## 2018-09-18 NOTE — Therapy (Signed)
[name], [name], [address] Phone: [phone]   Fax:  [phone]  Physical Therapy Evaluation  Patient Details  Name: Nicole Guerrero MRN: [id number] Date of Birth: [birth year] Referring Provider (PT): Pieter Partridge, DO   Encounter Date: 09/18/2018  PT End of Session - 09/18/18 0943    Visit Number  1    Number of Visits  12    Date for PT Re-Evaluation  10/31/18    Authorization Type  UHC Medicare (no auth required, no visit limit)    Authorization Time Period  09/18/2018-10/31/2018    Authorization - Visit Number  1    Authorization - Number of Visits  10    PT Start Time  0814    PT Stop Time  0859    PT Time Calculation (min)  45 min    Activity Tolerance  Patient tolerated treatment well    Behavior During Therapy  [facility] for tasks assessed/performed       Past Medical History:  Diagnosis Date  . GERD (gastroesophageal reflux disease)   . Viral endocarditis     Past Surgical History:  Procedure Laterality Date  . TUBAL LIGATION      There were no vitals filed for this visit.   Subjective Assessment - 09/18/18 0817    Subjective  Patient reports she has had Rt LE weakness for ~ 1 year or more. She states she had physical therapy for this in [place] and felt it helped earlier this year. She has been to a neurologist and they are planning to do an MRI on her brain to determine possible sources of the weakness. She feels that she understood them to say it could be related to her neck. She states they did bloodwork which all came back as normal. She is not sure when she will go back to work in [place] and this is why she would like to have PT her in [place]. 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 She is only able to see her through the window at this time.    Limitations  Standing;Walking    Currently in Pain?  No/denies          [facility] PT Assessment - 09/18/18 0001      Assessment   Medical Diagnosis  Rt LE Weakness    Referring Provider (PT)  Pieter Partridge, DO    Next MD Visit  unsure    Prior Therapy  Yes earlier this year      Precautions   Precautions  None      Restrictions   Weight Bearing Restrictions  No      Balance Screen   Has the patient fallen in the past 6 months  No    Has the patient had a decrease in activity level because of a fear of falling?   No    Is the patient reluctant to leave their home because of a fear of falling?   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 She loves her job! Right now she works form home and states they are slowly phasing people back into working on campus.     Leisure  Pt enjoys working in the yard (Geneticist, molecular).      Cognition   Overall Cognitive Status  Within Functional Limits for tasks assessed      Observation/Other Assessments   Focus on Therapeutic Outcomes (FOTO)   NA      Deep Tendon Reflexes   DTR Assessment Site  Patella;Achilles    Patella DTR  1+   cue for Jendrassik maneuver helped some   Achilles DTR  1+   cue for Jendrassik maneuver helped some     ROM / Strength   AROM / PROM / Strength  Strength      Strength   Strength  Assessment Site  Hip;Knee;Ankle    Right Hip Flexion  4-/5    Right Hip Extension  3+/5    Right Hip ABduction  4-/5    Left Hip Flexion  4+/5    Left Hip Extension  4/5    Left Hip ABduction  4-/5    Right Knee Flexion  4-/5    Right Knee Extension  3+/5    Left Knee Flexion  4+/5    Left Knee Extension  5/5    Right Ankle Dorsiflexion  3/5    Right Ankle Plantar Flexion  4-/5    Left Ankle Dorsiflexion  4+/5    Left Ankle Plantar Flexion  4/5      Transfers   Five time sit to stand comments   9.24 seconds with no UE use    Comments  pt's Rt knee with valgus shift upon standing      Ambulation/Gait   Ambulation/Gait  Yes    Ambulation/Gait Assistance  7: Independent    Ambulation Distance (Feet)  476 Feet    Assistive device  None    Gait Pattern  Step-through pattern;Decreased hip/knee flexion - right;Decreased hip/knee flexion - left;Trendelenburg   Rt knee latearl shifting while ambulating (varus to valgus)   Ambulation Surface  Level;Indoor    Gait velocity  1.18 m/s       Objective measurements completed on examination: See above findings.     [facility] Adult PT Treatment/Exercise - 09/18/18 0001      Exercises   Exercises  Knee/Hip      Knee/Hip Exercises: Standing   Heel Raises  Both;1 set;10 reps;3 seconds      Knee/Hip Exercises: Supine   Bridges  Both;1 set;10 reps    Bridges Limitations  5 sec holds      Knee/Hip Exercises: Sidelying   Clams  1x 10 rep sbil LE, 5 sec holds        PT Education - 09/18/18 0857    Education Details  Educated on exam findings and on initial HEP. Educated on appropriate POC for therapy. Educated on options to transition to telehealth services at later point in therapy if  patient begins returning to work and feels more comfortable with this.    Person(s) Educated  Patient    Methods  Explanation;Demonstration;Handout    Comprehension  Verbalized understanding;Returned demonstration       PT Short Term Goals - 09/18/18  1014      PT SHORT TERM GOAL #1   Title  Patient will be independent with HEP, updated PRN, to increase Rt LE strength and improve gait and mobility.    Time  2    Period  Weeks    Status  New    Target Date  10/03/18      PT SHORT TERM GOAL #2   Title  Patient will improve Rt LE strength by 1/2 grade throughout to indicate increased functional strength for improved mobiltiy.    Time  3    Period  Weeks    Status  New    Target Date  10/10/18        PT Long Term Goals - 09/18/18 1332      PT LONG TERM GOAL #1   Title  Patient will achieve 1 grade or more increase in Rt LE strength compared to evaluation to indicate significant increase in strength.    Time  6    Period  Weeks    Status  New    Target Date  10/31/18      PT LONG TERM GOAL #2   Title  Patient will perform 5x sit to stand with no eveidence of varus/valgus shift of Rt knee to indicate improved strength to maintain optimal alignment of Rt LE in functional activities.    Time  6    Period  Weeks    Status  New    Target Date  10/31/18      PT LONG TERM GOAL #3   Title  Patient will transition to regular exercise routine and/or walking program to maintain strength and improve overall health/fitness.    Time  6    Period  Weeks    Status  New    Target Date  10/31/18        Plan - 09/18/18 0945    Clinical Impression Statement  Ms. Dobkins presents to physical therapy for evaluation of Rt LE weakness. She is unsure of where it developed from but states there is some concern it may have developed form her cervical spine as she has a history of neck pain. She will be having an MRI of her brain in a couple of weeks to assess possible central sources. Objective testing revealed significant Rt LE weakness compared to Lt and patient has Rt knee instability with varus-to-valgus shift during functional gait and transfers. She is negative for clonus on bil LE's, negative with babinski testing, and her LE reflexes are  equally hyporeflexic. Patient is mobilizing at normal gait velocity with no device and denies Rt knee pain throughout. Rt ankle is hypomobile compared to Lt and she has reduced dorsiflexion. While etiology of Rt LE weakness is unknown, patient will benefit from skilled PT Interventions to address weakness and mobility deficits to improve function and QOL.    Personal Factors and Comorbidities  Age;Fitness    Examination-Activity Limitations  Locomotion Level    Stability/Clinical Decision Making  Stable/Uncomplicated    Clinical Decision Making  Low    Rehab Potential  Good    PT Frequency  2x / week    PT Duration  6 weeks    PT Treatment/Interventions  ADLs/Self  Care Home Management;Cryotherapy;Electrical Stimulation;Iontophoresis 4mg /ml Dexamethasone;Moist Heat;Traction;DME Instruction;Gait training;Stair training;Functional mobility training;Therapeutic activities;Therapeutic exercise;Balance training;Neuromuscular re-education;Taping;Passive range of motion;Manual techniques;Patient/family education    PT Next Visit Plan  Review goals. Initiate Rt LE strengthening and progress HEP with resistance band.    PT Home Exercise Plan  Eval: bridge, clamshell, heel raises    Consulted and Agree with Plan of Care  Patient       Patient will benefit from skilled therapeutic intervention in order to improve the following deficits and impairments:  Abnormal gait, Decreased activity tolerance, Decreased balance, Decreased mobility, Decreased strength, Difficulty walking  Visit Diagnosis: 1. Muscle weakness (generalized)   2. Other abnormalities of gait and mobility        Problem List Patient Active Problem List   Diagnosis Date Noted  . Cystocele, grade 3 10/22/2013  . OSTEOARTHRITIS, SHOULDER 05/19/2009  . SHOULDER PAIN, RIGHT 05/19/2009  . CERVICAL SPONDYLOSIS WITHOUT MYELOPATHY 05/19/2009  . NECK PAIN 05/19/2009    Kipp Brood, PT, DPT, [facility] Physical Therapist with [facility]  09/18/2018 1:39 PM    [place] [address] [place], [name], [address] Phone: [phone]   Fax:  [phone]  Name: Nicole Guerrero MRN: [id number] Date of Birth: 1951/02/28

## 2018-09-18 NOTE — Patient Instructions (Signed)
Supine Bridge reps: 10 sets: 3 hold: 5 seconds daily: 1  weekly: 7      Exercise image step 1   Exercise image step 2  Setup  Begin lying on your back with your arms resting at your sides, your legs bent at the knees and your feet flat on the ground. Movement  Tighten your abdominals and slowly lift your hips off the floor into a bridge position, keeping your back straight. Tip  Make sure to keep your trunk stiff throughout the exercise and your arms flat on the floor. Clamshell reps: 10 sets: 3 hold: 5 seconds daily: 1  weekly: 7      Exercise image step 1   Exercise image step 2  Setup  Begin lying on your side with your legs bent and feet together. Movement  Lift your top knee upward while keeping your feet together, then lower it back down and repeat. Tip  Make sure that your hips do not fall backward as you lift your leg. Focus your awareness on the movement of your leg. Disclaimer: This program provides exercises related to your condition that you can perform at home. As there is a risk of injury with any activity, use caution when performing exercises. If you experience any pain or discomfort, discontinue the exercises and contact your health care provider.  Login URL: [email or web address] . Access Code: [email or web address] . Date printed: 09/18/2018 Page 1  Heel Raise reps: 10 sets: 3 hold: 5 seconds daily: 1  weekly: 7      Exercise image step 1   Exercise image step 2  Setup  Begin standing tall with your feet slightly less than shoulder width apart and toes pointing forward. Movement  Lift your heels and raise up onto your toes, then lower back down, and repeat. Tip  Make sure to keep your balance and do not let your heels roll to either side.

## 2018-09-20 ENCOUNTER — Encounter (HOSPITAL_COMMUNITY): Payer: Self-pay

## 2018-09-20 ENCOUNTER — Other Ambulatory Visit: Payer: Self-pay

## 2018-09-20 ENCOUNTER — Ambulatory Visit (HOSPITAL_COMMUNITY): Payer: Medicare Other | Attending: Neurology

## 2018-09-20 DIAGNOSIS — R2689 Other abnormalities of gait and mobility: Secondary | ICD-10-CM | POA: Diagnosis not present

## 2018-09-20 DIAGNOSIS — M6281 Muscle weakness (generalized): Secondary | ICD-10-CM | POA: Diagnosis present

## 2018-09-20 NOTE — Therapy (Signed)
[place], [name], [address] Phone: [phone]   Fax:  [phone]  Physical Therapy Treatment  Patient Details  Name: Nicole Guerrero MRN: [id number] Date of Birth: 12-30-1950 Referring Provider (PT): Pieter Partridge, DO   Encounter Date: 09/20/2018  PT End of Session - 09/20/18 1509    Visit Number  2    Number of Visits  12    Date for PT Re-Evaluation  10/31/18    Authorization Type  [employer]HC Medicare (no auth required, no visit limit)    Authorization Time Period  09/18/2018-10/31/2018    Authorization - Visit Number  2    Authorization - Number of Visits  10    PT Start Time  1502    PT Stop Time  1540    PT Time Calculation (min)  38 min    Activity Tolerance  Patient tolerated treatment well    Behavior During Therapy  [facility] for tasks assessed/performed       Past Medical History:  Diagnosis Date  . GERD (gastroesophageal reflux disease)   . Viral endocarditis     Past Surgical History:  Procedure Laterality Date  . TUBAL LIGATION      There were no vitals filed for this visit.  Subjective Assessment - 09/20/18 1504    Subjective  Pt. reports she is feeling good today, no reports of pain today.  Has not began HEP yet, has been too busy.    Currently in Pain?  No/denies                       [facility] Adult PT Treatment/Exercise - 09/20/18 0001      Knee/Hip Exercises: Standing   Heel Raises  Both;1 set;10 reps;3 seconds    Heel Raises Limitations  Toe raises with UE A    Hip Abduction  Both;2 sets;5 reps    Abduction Limitations  cueing for form, tendency to trunk sidebend with task    SLS  Lt 10", Rt 4" max of 3      Knee/Hip Exercises: Seated   Long Arc Quad  Right;10 reps    Long Arc Quad Limitations  3" holds    Sit to [employer]  5 reps;without UE support      Knee/Hip Exercises: Supine   Short Arc [employer]  Right;15 reps    Short Arc [employer] Limitations  5" holds    Bridges   Both;2 sets;10 reps    Bridges Limitations  5 sec holds; cueing to reduce IR, added RTB with improved form      Knee/Hip Exercises: Sidelying   Clams  2x 10 rep sbil LE, 5 sec holds; added RTB 2nd set             PT Education - 09/20/18 1518    Education Details  Pt educated importance of compliance with HEP, pt stated she does a lot of walking and functional activities, encouraged to begin HEP as will strengthen specific weak muscles noted in eval    Methods  Explanation;Demonstration;Verbal cues    Comprehension  Verbalized understanding;Returned demonstration       PT Short Term Goals - 09/18/18 1014      PT SHORT TERM GOAL #1   Title  Patient will be independent with HEP, updated PRN, to increase Rt LE strength and improve gait and mobility.    Time  2    Period  Weeks  Status  New    Target Date  10/03/18      PT SHORT TERM GOAL #2   Title  Patient will improve Rt LE strength by 1/2 grade throughout to indicate increased functional strength for improved mobiltiy.    Time  3    Period  Weeks    Status  New    Target Date  10/10/18        PT Long Term Goals - 09/18/18 1332      PT LONG TERM GOAL #1   Title  Patient will achieve 1 grade or more increase in Rt LE strength compared to evaluation to indicate significant increase in strength.    Time  6    Period  Weeks    Status  New    Target Date  10/31/18      PT LONG TERM GOAL #2   Title  Patient will perform 5x sit to stand with no eveidence of varus/valgus shift of Rt knee to indicate improved strength to maintain optimal alignment of Rt LE in functional activities.    Time  6    Period  Weeks    Status  New    Target Date  10/31/18      PT LONG TERM GOAL #3   Title  Patient will transition to regular exercise routine and/or walking program to maintain strength and improve overall health/fitness.    Time  6    Period  Weeks    Status  New    Target Date  10/31/18            Plan - 09/20/18  1544    Clinical Impression Statement  Reviewed goals and educated importance of HEP compliance, pt stated she was too busy completing functional activities, pt encouraged to begin exercises for maximal benefits from therapy.  Session focus on LE strengthening, reviewed HEP exercises and added RTB for hip strengthening also to improve form with bridges and clams.  Progressed to standing exercises with verbal, tactile and demonstration to improve form especially with Rt LE abd, tendency to side bend at trunk due to gluteal weakness.  EOS pt limited by fatigue, no reports of pain through session.    Personal Factors and Comorbidities  Age;Fitness    Examination-Activity Limitations  Locomotion Level    Stability/Clinical Decision Making  Stable/Uncomplicated    Clinical Decision Making  Low    Rehab Potential  Good    PT Frequency  2x / week    PT Duration  6 weeks    PT Treatment/Interventions  ADLs/Self Care Home Management;Cryotherapy;Electrical Stimulation;Iontophoresis 4mg /ml Dexamethasone;Moist Heat;Traction;DME Instruction;Gait training;Stair training;Functional mobility training;Therapeutic activities;Therapeutic exercise;Balance training;Neuromuscular re-education;Taping;Passive range of motion;Manual techniques;Patient/family education    PT Next Visit Plan  F/U with HEP compliance.  Initiate Rt LE strengthening    PT Home Exercise Plan  Eval: bridge, clamshell, heel raises; 09/20/18: additional RTB with bridge and sidelying clams       Patient will benefit from skilled therapeutic intervention in order to improve the following deficits and impairments:  Abnormal gait, Decreased activity tolerance, Decreased balance, Decreased mobility, Decreased strength, Difficulty walking  Visit Diagnosis: 1. Other abnormalities of gait and mobility   2. Muscle weakness (generalized)        Problem List Patient Active Problem List   Diagnosis Date Noted  . Cystocele, grade 3 10/22/2013  .  OSTEOARTHRITIS, SHOULDER 05/19/2009  . SHOULDER PAIN, RIGHT 05/19/2009  . CERVICAL SPONDYLOSIS WITHOUT MYELOPATHY 05/19/2009  . NECK  PAIN 05/19/2009   Nicole Guerrero, Pink; [place]  Aldona Lento 09/20/2018, 3:51 PM  [place] [address] [place], [name], [address] Phone: [phone]   Fax:  [phone]  Name: LAMETRIA KLUNK MRN: [id number] Date of Birth: [birth year]

## 2018-09-26 ENCOUNTER — Ambulatory Visit (HOSPITAL_COMMUNITY): Payer: Medicare Other

## 2018-09-26 ENCOUNTER — Encounter (HOSPITAL_COMMUNITY): Payer: Self-pay

## 2018-09-26 ENCOUNTER — Other Ambulatory Visit: Payer: Self-pay

## 2018-09-26 DIAGNOSIS — R2689 Other abnormalities of gait and mobility: Secondary | ICD-10-CM | POA: Diagnosis not present

## 2018-09-26 DIAGNOSIS — M6281 Muscle weakness (generalized): Secondary | ICD-10-CM

## 2018-09-26 NOTE — Therapy (Signed)
[place], [name], [address] Phone: [phone]   Fax:  [phone]  Physical Therapy Treatment  Patient Details  Name: Nicole Guerrero MRN: [id number] Date of Birth: Dec 11, 1950 Referring Provider (PT): Pieter Partridge, DO   Encounter Date: 09/26/2018  PT End of Session - 09/26/18 0824    Visit Number  3    Number of Visits  12    Date for PT Re-Evaluation  10/31/18    Authorization Type  [employer]HC Medicare (no auth required, no visit limit)    Authorization Time Period  09/18/2018-10/31/2018    Authorization - Visit Number  3    Authorization - Number of Visits  10    PT Start Time  0816    PT Stop Time  0854    PT Time Calculation (min)  38 min    Activity Tolerance  Patient tolerated treatment well    Behavior During Therapy  [facility] for tasks assessed/performed       Past Medical History:  Diagnosis Date  . GERD (gastroesophageal reflux disease)   . Viral endocarditis     Past Surgical History:  Procedure Laterality Date  . TUBAL LIGATION      There were no vitals filed for this visit.  Subjective Assessment - 09/26/18 0819    Subjective  Pt stated she is feeling good today, no reports of pain today.  Stated she has completed HEP 1x this week, has been very busy and hard to find time.    Currently in Pain?  No/denies                       [facility] Adult PT Treatment/Exercise - 09/26/18 0001      Knee/Hip Exercises: Machines for Strengthening   Total Gym Leg Press  3PL 10x 2 (30#) cueing for control      Knee/Hip Exercises: Standing   Heel Raises  15 reps    Heel Raises Limitations  Toe raises with UE A incline slope    Hip Abduction  Both;5 reps;3 sets    Abduction Limitations  cueing for form, tendency to trunk sidebend with task    Hip Extension  2 sets;5 reps    Other Standing Knee Exercises  Tandem stance solid x 30"; tandem stance on foam 2x 30"      Knee/Hip Exercises: Seated   Sit  to Sand  2 sets;10 reps;without UE support   RTB around knees to reduce valgus     Knee/Hip Exercises: Supine   Bridges  Both;2 sets;10 reps    Bridges Limitations  5 sec holds; cueing to reduce IR, added RTB with improved form    Straight Leg Raises  10 reps      Knee/Hip Exercises: Sidelying   Clams  2x 10 rep sbil LE, 5 sec holds; added RTB 2nd set             PT Education - 09/26/18 0822    Education Details  Pt educated on importance of compliance with HEP for maximal benefits with therapy, encouraged to break up exercises throughout the day to improve compliance    Person(s) Educated  Patient    Methods  Explanation    Comprehension  Verbalized understanding       PT Short Term Goals - 09/18/18 1014      PT SHORT TERM GOAL #1   Title  Patient will be independent with HEP, updated PRN,  to increase Rt LE strength and improve gait and mobility.    Time  2    Period  Weeks    Status  New    Target Date  10/03/18      PT SHORT TERM GOAL #2   Title  Patient will improve Rt LE strength by 1/2 grade throughout to indicate increased functional strength for improved mobiltiy.    Time  3    Period  Weeks    Status  New    Target Date  10/10/18        PT Long Term Goals - 09/18/18 1332      PT LONG TERM GOAL #1   Title  Patient will achieve 1 grade or more increase in Rt LE strength compared to evaluation to indicate significant increase in strength.    Time  6    Period  Weeks    Status  New    Target Date  10/31/18      PT LONG TERM GOAL #2   Title  Patient will perform 5x sit to stand with no eveidence of varus/valgus shift of Rt knee to indicate improved strength to maintain optimal alignment of Rt LE in functional activities.    Time  6    Period  Weeks    Status  New    Target Date  10/31/18      PT LONG TERM GOAL #3   Title  Patient will transition to regular exercise routine and/or walking program to maintain strength and improve overall  health/fitness.    Time  6    Period  Weeks    Status  New    Target Date  10/31/18            Plan - 09/26/18 0902    Clinical Impression Statement  Began session reviewing HEP, pt able to recall all exercises with min cueing for form with clam exercise.  Pt educated on importance of increasing compliance at home for maximal benefits.  Session focus on LE strengthening primarly hip musculature.  Pt with tendency to increase genu valgum, added theraband resistance with clams, bridges and STS to improve alignment as well as with mirrow for visual feedback with form with standing exercises.  Also began tandem stance on solid then dynamic surface with min guard.  Pt able to complete all exericses with no report of pain, was limited by fatigue required 2 seated rest breaks.    Personal Factors and Comorbidities  Age;Fitness    Examination-Activity Limitations  Locomotion Level    Stability/Clinical Decision Making  Stable/Uncomplicated    Clinical Decision Making  Low    Rehab Potential  Good    PT Frequency  2x / week    PT Duration  6 weeks    PT Treatment/Interventions  ADLs/Self Care Home Management;Cryotherapy;Electrical Stimulation;Iontophoresis 4mg /ml Dexamethasone;Moist Heat;Traction;DME Instruction;Gait training;Stair training;Functional mobility training;Therapeutic activities;Therapeutic exercise;Balance training;Neuromuscular re-education;Taping;Passive range of motion;Manual techniques;Patient/family education    PT Next Visit Plan  Continue hip and LE strengthening exercises.  Begin wall squats and step ups next session for quad strengthening.    PT Home Exercise Plan  Eval: bridge, clamshell, heel raises; 09/20/18: additional RTB with bridge and sidelying clams       Patient will benefit from skilled therapeutic intervention in order to improve the following deficits and impairments:  Abnormal gait, Decreased activity tolerance, Decreased balance, Decreased mobility, Decreased  strength, Difficulty walking  Visit Diagnosis: 1. Muscle weakness (generalized)   2. Other  abnormalities of gait and mobility        Problem List Patient Active Problem List   Diagnosis Date Noted  . Cystocele, grade 3 10/22/2013  . OSTEOARTHRITIS, SHOULDER 05/19/2009  . SHOULDER PAIN, RIGHT 05/19/2009  . CERVICAL SPONDYLOSIS WITHOUT MYELOPATHY 05/19/2009  . NECK PAIN 05/19/2009   Ihor Austin, [place]; [place]  Aldona Lento 09/26/2018, 9:08 AM  [place] [address] [place], [name], [address] Phone: [phone]   Fax:  [phone]  Name: Nicole Guerrero MRN: [id number] Date of Birth: October 29, 1950

## 2018-09-27 ENCOUNTER — Telehealth (HOSPITAL_COMMUNITY): Payer: Self-pay | Admitting: Family Medicine

## 2018-09-27 NOTE — Telephone Encounter (Signed)
09/27/18  I left patient a message at 1pm to offer her an appointment this afternoon instead of coming in Thursday with Apolonio Schneiders.  Apolonio Schneiders will most likely not be here Thursday.  Nicole Guerrero today at 315 or [phone] with Amy is what was offered

## 2018-09-28 ENCOUNTER — Ambulatory Visit (HOSPITAL_COMMUNITY): Payer: Medicare Other

## 2018-10-02 ENCOUNTER — Other Ambulatory Visit: Payer: Self-pay

## 2018-10-02 ENCOUNTER — Encounter (HOSPITAL_COMMUNITY): Payer: Self-pay

## 2018-10-02 ENCOUNTER — Ambulatory Visit (HOSPITAL_COMMUNITY): Payer: Medicare Other

## 2018-10-02 DIAGNOSIS — M6281 Muscle weakness (generalized): Secondary | ICD-10-CM

## 2018-10-02 DIAGNOSIS — R2689 Other abnormalities of gait and mobility: Secondary | ICD-10-CM

## 2018-10-02 NOTE — Patient Instructions (Addendum)
Hip Hike    Stand on step, right leg off step, knee straight. Raise unsupported hip, keeping knee straight. Repeat 10 times per set. Do 2 sets per session. Do 1-2 sessions per day.  http://orth.exer.us/693   Copyright  VHI. All rights reserved.   Single Leg Balance: Eyes Open    Stand on right leg with eyes open. Hold 60 seconds. 3 reps 2 times per day.  http://ggbe.exer.us/5   Copyright  VHI. All rights reserved.

## 2018-10-02 NOTE — Therapy (Signed)
[place], [name], [address] Phone: [phone]   Fax:  [phone]  Physical Therapy Treatment  Patient Details  Name: Nicole Guerrero MRN: [id number] Date of Birth: 1950-08-03 Referring Provider (PT): Pieter Partridge, DO   Encounter Date: 10/02/2018  PT End of Session - 10/02/18 4270    Visit Number  4    Number of Visits  12    Date for PT Re-Evaluation  10/31/18    Authorization Type  [employer] Medicare (no auth required, no visit limit)    Authorization Time Period  09/18/2018-10/31/2018    Authorization - Visit Number  4    Authorization - Number of Visits  10    PT Start Time  0916    PT Stop Time  0958    PT Time Calculation (min)  42 min    Activity Tolerance  Patient tolerated treatment well    Behavior During Therapy  [facility] for tasks assessed/performed       Past Medical History:  Diagnosis Date  . GERD (gastroesophageal reflux disease)   . Viral endocarditis     Past Surgical History:  Procedure Laterality Date  . TUBAL LIGATION      There were no vitals filed for this visit.  Subjective Assessment - 10/02/18 0918    Subjective  Pt stated she is feeling good today, no reoprts of pain.    Currently in Pain?  No/denies                       [facility] Adult PT Treatment/Exercise - 10/02/18 0001      Knee/Hip Exercises: Standing   Heel Raises  15 reps    Heel Raises Limitations  Toe raises with UE A incline slope    Lateral Step Up  Right;10 reps;Hand Hold: 1;Step Height: 4"    Forward Step Up  Right;10 reps;Hand Hold: 1;Step Height: 4"    Wall Squat  10 reps;3 seconds    SLS  Lt 30", Rt 9" max    SLS with Vectors  3x 5" BLE    Other Standing Knee Exercises  Tandem stance on foam 30" each    Other Standing Knee Exercises  hip hike 10x BLE off 4in step; sidestep 2RT no resistance      Knee/Hip Exercises: Seated   Sit to Sand  1 set;10 reps;without UE support                PT Short Term Goals - 09/18/18 1014      PT SHORT TERM GOAL #1   Title  Patient will be independent with HEP, updated PRN, to increase Rt LE strength and improve gait and mobility.    Time  2    Period  Weeks    Status  New    Target Date  10/03/18      PT SHORT TERM GOAL #2   Title  Patient will improve Rt LE strength by 1/2 grade throughout to indicate increased functional strength for improved mobiltiy.    Time  3    Period  Weeks    Status  New    Target Date  10/10/18        PT Long Term Goals - 09/18/18 1332      PT LONG TERM GOAL #1   Title  Patient will achieve 1 grade or more increase in Rt LE strength compared to evaluation to indicate significant increase  in strength.    Time  6    Period  Weeks    Status  New    Target Date  10/31/18      PT LONG TERM GOAL #2   Title  Patient will perform 5x sit to stand with no eveidence of varus/valgus shift of Rt knee to indicate improved strength to maintain optimal alignment of Rt LE in functional activities.    Time  6    Period  Weeks    Status  New    Target Date  10/31/18      PT LONG TERM GOAL #3   Title  Patient will transition to regular exercise routine and/or walking program to maintain strength and improve overall health/fitness.    Time  6    Period  Weeks    Status  New    Target Date  10/31/18            Plan - 10/02/18 1114    Clinical Impression Statement  Session focus on LE strengthening with additional exercises added for functional strengthening.  Noted trendelenburg gait mechanics due to weakness in Rt hip musculature.  Added sidestep and hip hiking to target glut med for strengthening as well as wall squats and forward/lateral step ups for quad strengthening.  Therapist facilitation with form for majority of exercises to reduce compensation and improve mechanics espceially with hip hiking.  No reports of pain, was limited by fatigue.    Personal Factors and Comorbidities   Age;Fitness    Examination-Activity Limitations  Locomotion Level    Stability/Clinical Decision Making  Stable/Uncomplicated    Clinical Decision Making  Low    Rehab Potential  Good    PT Frequency  2x / week    PT Duration  6 weeks    PT Treatment/Interventions  ADLs/Self Care Home Management;Cryotherapy;Electrical Stimulation;Iontophoresis 4mg /ml Dexamethasone;Moist Heat;Traction;DME Instruction;Gait training;Stair training;Functional mobility training;Therapeutic activities;Therapeutic exercise;Balance training;Neuromuscular re-education;Taping;Passive range of motion;Manual techniques;Patient/family education    PT Next Visit Plan  Continue hip and LE strengthening exercises.  F/U with newest HEP compliance    PT Home Exercise Plan  Eval: bridge, clamshell, heel raises; 09/20/18: additional RTB with bridge and sidelying clams; 10/02/18: hip hiking       Patient will benefit from skilled therapeutic intervention in order to improve the following deficits and impairments:  Abnormal gait, Decreased activity tolerance, Decreased balance, Decreased mobility, Decreased strength, Difficulty walking  Visit Diagnosis: 1. Muscle weakness (generalized)   2. Other abnormalities of gait and mobility        Problem List Patient Active Problem List   Diagnosis Date Noted  . Cystocele, grade 3 10/22/2013  . OSTEOARTHRITIS, SHOULDER 05/19/2009  . SHOULDER PAIN, RIGHT 05/19/2009  . CERVICAL SPONDYLOSIS WITHOUT MYELOPATHY 05/19/2009  . NECK PAIN 05/19/2009   Ihor Austin, [place]; [place]  Aldona Lento 10/02/2018, 12:59 PM  [place] [address] [place], [name], [address] Phone: [phone]   Fax:  [phone]  Name: Nicole Guerrero MRN: [id number] Date of Birth: [birth year]

## 2018-10-03 ENCOUNTER — Ambulatory Visit
Admission: RE | Admit: 2018-10-03 | Discharge: 2018-10-03 | Disposition: A | Discharge status: Home / Self Care | Payer: Medicare Other | Source: Ambulatory Visit | Attending: Neurology | Admitting: Neurology

## 2018-10-03 ENCOUNTER — Other Ambulatory Visit: Payer: Self-pay | Admitting: Neurology

## 2018-10-03 DIAGNOSIS — R6889 Other general symptoms and signs: Secondary | ICD-10-CM

## 2018-10-03 DIAGNOSIS — M542 Cervicalgia: Secondary | ICD-10-CM

## 2018-10-03 DIAGNOSIS — N889 Noninflammatory disorder of cervix uteri, unspecified: Secondary | ICD-10-CM

## 2018-10-03 DIAGNOSIS — M47812 Spondylosis without myelopathy or radiculopathy, cervical region: Secondary | ICD-10-CM

## 2018-10-03 DIAGNOSIS — R29898 Other symptoms and signs involving the musculoskeletal system: Secondary | ICD-10-CM

## 2018-10-03 MED ORDER — GADOBENATE DIMEGLUMINE 529 MG/ML IV SOLN
12.0000 mL | Freq: Once | INTRAVENOUS | Status: AC | PRN
  Administered 2018-10-03: 12 mL via INTRAVENOUS

## 2018-10-04 ENCOUNTER — Ambulatory Visit (HOSPITAL_COMMUNITY): Payer: Medicare Other

## 2018-10-04 ENCOUNTER — Encounter (HOSPITAL_COMMUNITY): Payer: Self-pay

## 2018-10-04 ENCOUNTER — Other Ambulatory Visit: Payer: Self-pay

## 2018-10-04 DIAGNOSIS — R2689 Other abnormalities of gait and mobility: Secondary | ICD-10-CM | POA: Diagnosis not present

## 2018-10-04 DIAGNOSIS — M6281 Muscle weakness (generalized): Secondary | ICD-10-CM

## 2018-10-04 NOTE — Therapy (Signed)
[place], [name], [address] Phone: [phone]   Fax:  [phone]  Physical Therapy Treatment & Progress Note  Patient Details  Name: Nicole Guerrero MRN: [id number] Date of Birth: 15-Sep-1950 Referring Provider (PT): Pieter Partridge, DO   Encounter Date: 10/04/2018   Progress Note Reporting Period 09/18/18 to 10/04/18  See note below for Objective Data and Assessment of Progress/Goals.    PT End of Session - 10/04/18 0848    Visit Number  5    Number of Visits  12    Date for PT Re-Evaluation  10/31/18    Authorization Type  [employer]HC Medicare (no auth required, no visit limit)    Authorization Time Period  09/18/2018-10/31/2018    Authorization - Visit Number  1    Authorization - Number of Visits  10    PT Start Time  0820    PT Stop Time  0859    PT Time Calculation (min)  39 min    Activity Tolerance  Patient tolerated treatment well    Behavior During Therapy  WFL for tasks assessed/performed       Past Medical History:  Diagnosis Date  . GERD (gastroesophageal reflux disease)   . Viral endocarditis     Past Surgical History:  Procedure Laterality Date  . TUBAL LIGATION      There were no vitals filed for this visit.  Subjective Assessment - 10/04/18 0822    Subjective  Pt states she is doing her exercises at home but does only 1 set of each or some exercises. She denies pain and reports she feels she is walking better compared to when she started.    Currently in Pain?  No/denies         [facility] PT Assessment - 10/04/18 0001      Assessment   Medical Diagnosis  Rt LE Weakness    Referring Provider (PT)  Pieter Partridge, DO    Prior Therapy  Yes earlier this year      Cognition   Overall Cognitive Status  Within Functional Limits for tasks assessed      ROM / Strength   AROM / PROM / Strength  Strength      Strength   Right Hip Flexion  4/5   4-   Right Hip Extension  4-/5   3+   Right  Hip ABduction  4/5   4-   Left Hip Flexion  4+/5   4+   Left Hip Extension  4+/5   4   Left Hip ABduction  4/5   4-   Right Knee Flexion  4/5   4-   Right Knee Extension  4/5   3+   Left Knee Flexion  5/5   4+   Left Knee Extension  5/5   5   Right Ankle Dorsiflexion  4-/5   3   Right Ankle Plantar Flexion  4/5   4-   Left Ankle Dorsiflexion  5/5   4+   Left Ankle Plantar Flexion  5/5   4       [facility] Adult PT Treatment/Exercise - 10/04/18 0001      Knee/Hip Exercises: Standing   Forward Lunges  Both;2 sets;10 reps    Lateral Step Up  Right;2 sets;15 reps;Hand Hold: 0;Step Height: 6"    Forward Step Up  Right;2 sets;15 reps;Hand Hold: 0;Step Height: 6"    Forward Step Up Limitations  Reverse  Step up: Right;2 sets;15 reps;Hand Hold: 0;Step Height: 6"    SLS with Vectors  5 reps bil LE, 5 sec holds 3 way vector no HHA   Rt UE support on Rt SLS   Other Standing Knee Exercises  Hip Hike: 2x 15 reps, bil UE support      Knee/Hip Exercises: Sidelying   Hip ABduction  Strengthening;Both;2 sets;10 reps        PT Education - 10/04/18 0851    Education Details  Educated on progress towards goals based on MMT. Educated on exercises throughout.    Person(s) Educated  Patient    Methods  Explanation;Demonstration    Comprehension  Verbalized understanding;Returned demonstration       PT Short Term Goals - 09/18/18 1014      PT SHORT TERM GOAL #1   Title  Patient will be independent with HEP, updated PRN, to increase Rt LE strength and improve gait and mobility.    Time  2    Period  Weeks    Status  New    Target Date  10/03/18      PT SHORT TERM GOAL #2   Title  Patient will improve Rt LE strength by 1/2 grade throughout to indicate increased functional strength for improved mobiltiy.    Time  3    Period  Weeks    Status  New    Target Date  10/10/18        PT Long Term Goals - 09/18/18 1332      PT LONG TERM GOAL #1   Title  Patient will achieve 1 grade  or more increase in Rt LE strength compared to evaluation to indicate significant increase in strength.    Time  6    Period  Weeks    Status  New    Target Date  10/31/18      PT LONG TERM GOAL #2   Title  Patient will perform 5x sit to stand with no eveidence of varus/valgus shift of Rt knee to indicate improved strength to maintain optimal alignment of Rt LE in functional activities.    Time  6    Period  Weeks    Status  New    Target Date  10/31/18      PT LONG TERM GOAL #3   Title  Patient will transition to regular exercise routine and/or walking program to maintain strength and improve overall health/fitness.    Time  6    Period  Weeks    Status  New    Target Date  10/31/18        Plan - 10/04/18 0851    Clinical Impression Statement  Re-assessed patient's LE strength today via MMT and she has made excellent progress with 1/2 grade or greater improvement throughout entire Rt LE and some groups in Lt LE as well. She haas also reported improved walking quality over the last couple weeks. She was able to progress exercises this session with increased repetition and added functional lunges requiring minimal tactile cues for proper form. She denied pain throughout session and was educated on DOMS which may develop from advancing her exercises. Patient was educated to progress the repetitions of her HEP to progress strengthening at home. She will continue to benefit from skilled PT interventions to address ongoing weakness and gait deficits.    Personal Factors and Comorbidities  Age;Fitness    Examination-Activity Limitations  Locomotion Level    Stability/Clinical Decision Making  Stable/Uncomplicated    Rehab Potential  Good    PT Frequency  2x / week    PT Duration  6 weeks    PT Treatment/Interventions  ADLs/Self Care Home Management;Cryotherapy;Electrical Stimulation;Iontophoresis 4mg /ml Dexamethasone;Moist Heat;Traction;DME Instruction;Gait training;Stair training;Functional  mobility training;Therapeutic activities;Therapeutic exercise;Balance training;Neuromuscular re-education;Taping;Passive range of motion;Manual techniques;Patient/family education    PT Next Visit Plan  Continue hip and LE strengthening exercises.  F/U with increased repetitions/sets for HEP. Add hip abd in sidelying to HEP.    PT Home Exercise Plan  Eval: bridge, clamshell, heel raises; 09/20/18: additional RTB with bridge and sidelying clams; 10/02/18: hip hiking    Consulted and Agree with Plan of Care  Patient       Patient will benefit from skilled therapeutic intervention in order to improve the following deficits and impairments:  Abnormal gait, Decreased activity tolerance, Decreased balance, Decreased mobility, Decreased strength, Difficulty walking  Visit Diagnosis: 1. Muscle weakness (generalized)   2. Other abnormalities of gait and mobility        Problem List Patient Active Problem List   Diagnosis Date Noted  . Cystocele, grade 3 10/22/2013  . OSTEOARTHRITIS, SHOULDER 05/19/2009  . SHOULDER PAIN, RIGHT 05/19/2009  . CERVICAL SPONDYLOSIS WITHOUT MYELOPATHY 05/19/2009  . NECK PAIN 05/19/2009    Kipp Brood, PT, DPT, [facility] Physical Therapist with [facility]  10/04/2018 9:10 AM    [place] [address] [place], [name], [address] Phone: [phone]   Fax:  [phone]  Name: Nicole Guerrero MRN: [id number] Date of Birth: 1950-12-12

## 2018-10-09 ENCOUNTER — Telehealth: Payer: Self-pay

## 2018-10-09 NOTE — Telephone Encounter (Signed)
Called and LMOVM for Pt to return call for MRI results

## 2018-10-09 NOTE — Telephone Encounter (Signed)
-----   Message from Pieter Partridge, DO sent at 10/05/2018  7:12 AM EDT ----- MRI of brain is unremarkable.  The finding on the MRI of cervical spine is stable.  It is still there but no changes/progression.  As previously discussed, I would next like to proceed with LP for CSF analysis of cell count, protein, glucose, oligoclonal bands, IgG index, Lyme, gram stain and culture.

## 2018-10-10 ENCOUNTER — Other Ambulatory Visit: Payer: Self-pay

## 2018-10-10 ENCOUNTER — Ambulatory Visit (HOSPITAL_COMMUNITY): Payer: Medicare Other | Admitting: Physical Therapy

## 2018-10-10 DIAGNOSIS — R2689 Other abnormalities of gait and mobility: Secondary | ICD-10-CM

## 2018-10-10 DIAGNOSIS — M6281 Muscle weakness (generalized): Secondary | ICD-10-CM

## 2018-10-10 NOTE — Therapy (Signed)
[place], [name], [address] Phone: [phone]   Fax:  [phone]  Physical Therapy Treatment  Patient Details  Name: Nicole Guerrero MRN: [id number] Date of Birth: 1950/05/28 Referring Provider (PT): Pieter Partridge, DO   Encounter Date: 10/10/2018  PT End of Session - 10/10/18 0929    Visit Number  6    Number of Visits  12    Date for PT Re-Evaluation  10/31/18    Authorization Type  [employer] Medicare (no auth required, no visit limit)    Authorization Time Period  09/18/2018-10/31/2018    Authorization - Visit Number  1    Authorization - Number of Visits  10    PT Start Time  [phone]    PT Stop Time  0913    PT Time Calculation (min)  41 min    Activity Tolerance  Patient tolerated treatment well    Behavior During Therapy  [facility] for tasks assessed/performed       Past Medical History:  Diagnosis Date  . GERD (gastroesophageal reflux disease)   . Viral endocarditis     Past Surgical History:  Procedure Laterality Date  . TUBAL LIGATION      There were no vitals filed for this visit.  Subjective Assessment - 10/10/18 0841    Subjective  pt states she doesn't know if she increased her sets of exercises at home??  states she really doesn't count them or do the same ones every session.  REports no pain or issues today.    Currently in Pain?  No/denies                       [facility] Adult PT Treatment/Exercise - 10/10/18 0001      Knee/Hip Exercises: Standing   Heel Raises  15 reps    Forward Lunges  Both;2 sets;10 reps    Lateral Step Up  Right;2 sets;15 reps;Hand Hold: 0;Step Height: 6"    Forward Step Up  Right;2 sets;15 reps;Hand Hold: 0;Step Height: 6"    Step Down  Both;2 sets;Step Height: 4";Hand Hold: 1    Wall Squat  10 reps;3 seconds    SLS with Vectors  10 reps bil LE, 5 sec holds 3 way vector no HHA    Other Standing Knee Exercises  tandem on foam with 1# UE flexion 10 reps each LE  lead    Other Standing Knee Exercises  Hip Hike: 2x 15 reps, bil UE support   on 4" box with 1 HHA     Knee/Hip Exercises: Seated   Sit to Sand  1 set;10 reps;without UE support      Knee/Hip Exercises: Sidelying   Hip ABduction  Strengthening;Both;10 reps;1 set             PT Education - 10/10/18 0927    Education Details  explained importance of counting her reps and keeping up with her exercises to ensure progression and prevent soreness.  Updated with sidelying hip abduction per last visit POC.  Pt provided handout.    Person(s) Educated  Patient    Methods  Explanation;Demonstration;Tactile cues;Verbal cues;Handout    Comprehension  Verbalized understanding;Returned demonstration;Verbal cues required;Tactile cues required;Need further instruction       PT Short Term Goals - 09/18/18 1014      PT SHORT TERM GOAL #1   Title  Patient will be independent with HEP, updated PRN, to increase Rt LE strength  and improve gait and mobility.    Time  2    Period  Weeks    Status  New    Target Date  10/03/18      PT SHORT TERM GOAL #2   Title  Patient will improve Rt LE strength by 1/2 grade throughout to indicate increased functional strength for improved mobiltiy.    Time  3    Period  Weeks    Status  New    Target Date  10/10/18        PT Long Term Goals - 09/18/18 1332      PT LONG TERM GOAL #1   Title  Patient will achieve 1 grade or more increase in Rt LE strength compared to evaluation to indicate significant increase in strength.    Time  6    Period  Weeks    Status  New    Target Date  10/31/18      PT LONG TERM GOAL #2   Title  Patient will perform 5x sit to stand with no eveidence of varus/valgus shift of Rt knee to indicate improved strength to maintain optimal alignment of Rt LE in functional activities.    Time  6    Period  Weeks    Status  New    Target Date  10/31/18      PT LONG TERM GOAL #3   Title  Patient will transition to regular  exercise routine and/or walking program to maintain strength and improve overall health/fitness.    Time  6    Period  Weeks    Status  New    Target Date  10/31/18            Plan - 10/10/18 0930    Clinical Impression Statement  continued with established exercise with pateint presenting poor recall of ever completing approx 50% of the exercises.  Most completed 2 sets and increased challenge of tandem stance to include UE flexion using weighted bar.  Pt able to complete without LOB.  Increased reps of vectors and hold of wall squats.  Frequent cues for form, especially with squats and lunges as tends to place feet too close together.  Pt without any pain or issues at end of session.  Updated HEP to include sidelying hip abudciton with cues for form.  Printout given.  Encouraged to keep count of her exercises for progression and prevent soreness.    Personal Factors and Comorbidities  Age;Fitness    Examination-Activity Limitations  Locomotion Level    Stability/Clinical Decision Making  Stable/Uncomplicated    Rehab Potential  Good    PT Frequency  2x / week    PT Duration  6 weeks    PT Treatment/Interventions  ADLs/Self Care Home Management;Cryotherapy;Electrical Stimulation;Iontophoresis 4mg /ml Dexamethasone;Moist Heat;Traction;DME Instruction;Gait training;Stair training;Functional mobility training;Therapeutic activities;Therapeutic exercise;Balance training;Neuromuscular re-education;Taping;Passive range of motion;Manual techniques;Patient/family education    PT Next Visit Plan  Continue hip and LE strengthening exercises.  F/U with increased repetitions/sets for HEP.  May want to review full HEP to ensure pateint can recall all exercises.    PT Home Exercise Plan  Eval: bridge, clamshell, heel raises; 09/20/18: additional RTB with bridge and sidelying clams; 10/02/18: hip hiking  10/10/18:  sidelying hip abduction    Consulted and Agree with Plan of Care  Patient       Patient will  benefit from skilled therapeutic intervention in order to improve the following deficits and impairments:  Abnormal gait, Decreased activity  tolerance, Decreased balance, Decreased mobility, Decreased strength, Difficulty walking  Visit Diagnosis: 1. Muscle weakness (generalized)   2. Other abnormalities of gait and mobility        Problem List Patient Active Problem List   Diagnosis Date Noted  . Cystocele, grade 3 10/22/2013  . OSTEOARTHRITIS, SHOULDER 05/19/2009  . SHOULDER PAIN, RIGHT 05/19/2009  . CERVICAL SPONDYLOSIS WITHOUT MYELOPATHY 05/19/2009  . NECK PAIN 05/19/2009   Teena Irani, PTA/CLT [phone]  Teena Irani 10/10/2018, 9:33 AM  [place] [address] [place], [name], [address] Phone: [phone]   Fax:  [phone]  Name: TAKERRA LUPINACCI MRN: [id number] Date of Birth: 03/02/1951

## 2018-10-10 NOTE — Patient Instructions (Signed)
Abduction: Side Leg Lift (Eccentric) - Side-Lying    Lie on side. Lift top leg slightly higher than shoulder level. Keep top leg straight with body, toes pointing forward. Slowly lower for 3-5 seconds. _10__ reps per set, _2__ sets per day, _7__ days per week.

## 2018-10-11 ENCOUNTER — Telehealth: Payer: Self-pay

## 2018-10-11 ENCOUNTER — Telehealth: Payer: Self-pay | Admitting: Neurology

## 2018-10-11 NOTE — Telephone Encounter (Signed)
-----   Message from Pieter Partridge, DO sent at 10/05/2018  7:12 AM EDT ----- MRI of brain is unremarkable.  The finding on the MRI of cervical spine is stable.  It is still there but no changes/progression.  As previously discussed, I would next like to proceed with LP for CSF analysis of cell count, protein, glucose, oligoclonal bands, IgG index, Lyme, gram stain and culture.

## 2018-10-11 NOTE — Telephone Encounter (Signed)
LMOVM for Pt, see imaging results note

## 2018-10-11 NOTE — Telephone Encounter (Signed)
Called LMOVM for Pt to return call tomorrow after 8am for imaging study results

## 2018-10-11 NOTE — Telephone Encounter (Signed)
Patient left msg about needing lab work results.

## 2018-10-12 ENCOUNTER — Encounter (HOSPITAL_COMMUNITY): Payer: Self-pay

## 2018-10-12 ENCOUNTER — Ambulatory Visit (HOSPITAL_COMMUNITY): Payer: Medicare Other

## 2018-10-12 ENCOUNTER — Other Ambulatory Visit: Payer: Self-pay

## 2018-10-12 ENCOUNTER — Inpatient Hospital Stay: Admission: RE | Admit: 2018-10-12 | Payer: Medicare Other | Source: Ambulatory Visit

## 2018-10-12 DIAGNOSIS — R2689 Other abnormalities of gait and mobility: Secondary | ICD-10-CM

## 2018-10-12 DIAGNOSIS — M6281 Muscle weakness (generalized): Secondary | ICD-10-CM

## 2018-10-12 NOTE — Therapy (Signed)
[place], [name], [address] Phone: [phone]   Fax:  [phone]  Physical Therapy Treatment  Patient Details  Name: Nicole Guerrero MRN: [id number] Date of Birth: 1950-08-20 Referring Provider (PT): Pieter Partridge, DO   Encounter Date: 10/12/2018  PT End of Session - 10/12/18 5726    Visit Number  7    Number of Visits  12    Date for PT Re-Evaluation  10/31/18    Authorization Type  [employer] Medicare (no auth required, no visit limit)    Authorization Time Period  09/18/2018-10/31/2018    Authorization - Visit Number  3    Authorization - Number of Visits  10    PT Start Time  0824    PT Stop Time  0904    PT Time Calculation (min)  40 min    Activity Tolerance  Patient tolerated treatment well    Behavior During Therapy  [facility] for tasks assessed/performed       Past Medical History:  Diagnosis Date  . GERD (gastroesophageal reflux disease)   . Viral endocarditis     Past Surgical History:  Procedure Laterality Date  . TUBAL LIGATION      There were no vitals filed for this visit.  Subjective Assessment - 10/12/18 0828    Subjective  Patient reports HEP is challenging and has not gotten any easier. She denies pain at start.    Limitations  Standing;Walking    Currently in Pain?  No/denies       [facility] Adult PT Treatment/Exercise - 10/12/18 0001      Knee/Hip Exercises: Standing   Heel Raises  Both;1 set;20 reps;3 seconds    Step Down  Both;2 sets;10 reps;Hand Hold: 2;Step Height: 4"    SLS with Vectors  2x 5 reps Bil LE on foam, 3 way vector    Other Standing Knee Exercises  Tandem Stance: on foam, 10x UE flexion with 3# dowel (alt foot align for 2 sets)    Other Standing Knee Exercises  Hip Hike: 1x 10 reps, bil UE support      Knee/Hip Exercises: Seated   Sit to Sand  10 reps;without UE support;2 sets      Knee/Hip Exercises: Supine   Bridges  Both;2 sets;10 reps    Bridges Limitations  5 sec  holds      Knee/Hip Exercises: Sidelying   Hip ABduction  Both;2 sets;10 reps    Clams  2x 10 reps bil LE, 5 sec holds        PT Education - 10/12/18 2035    Education Details  Reviewed HEP to ensure competance with exercises. Educated on form throughout with more advanced exercises.    Person(s) Educated  Patient    Methods  Explanation    Comprehension  Verbalized understanding       PT Short Term Goals - 10/12/18 0831      PT SHORT TERM GOAL #1   Title  Patient will be independent with HEP, updated PRN, to increase Rt LE strength and improve gait and mobility.    Time  2    Period  Weeks    Status  Achieved    Target Date  10/03/18      PT SHORT TERM GOAL #2   Title  Patient will improve Rt LE strength by 1/2 grade throughout to indicate increased functional strength for improved mobiltiy.    Time  3  Period  Weeks    Status  Achieved    Target Date  10/10/18        PT Long Term Goals - 10/12/18 0831      PT LONG TERM GOAL #1   Title  Patient will achieve 1 grade or more increase in Rt LE strength compared to evaluation to indicate significant increase in strength.    Time  6    Period  Weeks    Status  On-going      PT LONG TERM GOAL #2   Title  Patient will perform 5x sit to stand with no eveidence of varus/valgus shift of Rt knee to indicate improved strength to maintain optimal alignment of Rt LE in functional activities.    Time  6    Period  Weeks    Status  On-going      PT LONG TERM GOAL #3   Title  Patient will transition to regular exercise routine and/or walking program to maintain strength and improve overall health/fitness.    Time  6    Period  Weeks    Status  On-going        Plan - 10/12/18 6269    Clinical Impression Statement  Initiated session this date with review of all HEP exercises to ensure pt has proper form. She required cuing for hip abduction exercise to prevent use of hip flexors and with hip hike to prevent knee flexion on  stance leg. She progressed to step down exercise on 4" stair and maintained good eccentric control throughout. Patient had greater difficulty with Rt knee as it hyperextends when standing back up to activate quadriceps. She progressed SLS challenge to compliant surface and required 1 UE support for Rt SLS and no support on Lt. Patient provided new copy of current HEP to continue completing at home. She will continue to benefit from skilled PT interventions to address impairments and progress towards goals.    Personal Factors and Comorbidities  Age;Fitness    Examination-Activity Limitations  Locomotion Level    Stability/Clinical Decision Making  Stable/Uncomplicated    Rehab Potential  Good    PT Frequency  2x / week    PT Duration  6 weeks    PT Treatment/Interventions  ADLs/Self Care Home Management;Cryotherapy;Electrical Stimulation;Iontophoresis 4mg /ml Dexamethasone;Moist Heat;Traction;DME Instruction;Gait training;Stair training;Functional mobility training;Therapeutic activities;Therapeutic exercise;Balance training;Neuromuscular re-education;Taping;Passive range of motion;Manual techniques;Patient/family education    PT Next Visit Plan  Continue hip and LE strengthening exercises.  F/u with HEP completion following review and new handout. Progress exercises in standing and add TB for abductor activation with sit<>stand.    PT Home Exercise Plan  Eval: bridge, clamshell, heel raises; 09/20/18: additional RTB with bridge and sidelying clams; 10/02/18: hip hiking  10/10/18:  sidelying hip abduction    Consulted and Agree with Plan of Care  Patient       Patient will benefit from skilled therapeutic intervention in order to improve the following deficits and impairments:  Abnormal gait, Decreased activity tolerance, Decreased balance, Decreased mobility, Decreased strength, Difficulty walking  Visit Diagnosis: 1. Muscle weakness (generalized)   2. Other abnormalities of gait and mobility         Problem List Patient Active Problem List   Diagnosis Date Noted  . Cystocele, grade 3 10/22/2013  . OSTEOARTHRITIS, SHOULDER 05/19/2009  . SHOULDER PAIN, RIGHT 05/19/2009  . CERVICAL SPONDYLOSIS WITHOUT MYELOPATHY 05/19/2009  . NECK PAIN 05/19/2009    Kipp Brood, PT, DPT, [facility] Physical Therapist with Larence Penning  [facility]  10/12/2018 8:59 AM    [place], [name], [address] Phone: [phone]   Fax:  [phone]  Name: JOSEPH JOHNS MRN: [id number] Date of Birth: [birth year]

## 2018-10-13 ENCOUNTER — Ambulatory Visit
Admission: RE | Admit: 2018-10-13 | Discharge: 2018-10-13 | Disposition: A | Discharge status: Home / Self Care | Payer: Medicare Other | Source: Ambulatory Visit | Attending: Neurology | Admitting: Neurology

## 2018-10-13 DIAGNOSIS — R29898 Other symptoms and signs involving the musculoskeletal system: Secondary | ICD-10-CM

## 2018-10-13 DIAGNOSIS — N889 Noninflammatory disorder of cervix uteri, unspecified: Secondary | ICD-10-CM

## 2018-10-13 MED ORDER — GADOBENATE DIMEGLUMINE 529 MG/ML IV SOLN
12.0000 mL | Freq: Once | INTRAVENOUS | Status: AC | PRN
  Administered 2018-10-13: 12 mL via INTRAVENOUS

## 2018-10-16 ENCOUNTER — Ambulatory Visit (HOSPITAL_COMMUNITY): Payer: Medicare Other | Admitting: Physical Therapy

## 2018-10-17 ENCOUNTER — Telehealth: Payer: Self-pay

## 2018-10-17 DIAGNOSIS — N889 Noninflammatory disorder of cervix uteri, unspecified: Secondary | ICD-10-CM

## 2018-10-17 DIAGNOSIS — R6889 Other general symptoms and signs: Secondary | ICD-10-CM

## 2018-10-17 DIAGNOSIS — R29898 Other symptoms and signs involving the musculoskeletal system: Secondary | ICD-10-CM

## 2018-10-17 NOTE — Telephone Encounter (Signed)
Called patient no answer left message to call office back for results.  Order for MRI throraic spine was placed today

## 2018-10-17 NOTE — Telephone Encounter (Signed)
-----   Message from Jesse Fall, RN sent at 10/17/2018  1:30 PM EDT -----  ----- Message ----- From: Pieter Partridge, DO Sent: 10/16/2018  12:29 PM EDT To: Clois Comber, CMA  MRI of lumbar spine looks unremarkable.  She was supposed to have MRI of thoracic spine as well.

## 2018-10-18 ENCOUNTER — Encounter (HOSPITAL_COMMUNITY): Payer: Medicare Other

## 2018-10-19 ENCOUNTER — Other Ambulatory Visit: Payer: Self-pay

## 2018-10-19 ENCOUNTER — Ambulatory Visit (HOSPITAL_COMMUNITY): Payer: Medicare Other

## 2018-10-19 ENCOUNTER — Telehealth: Payer: Self-pay

## 2018-10-19 ENCOUNTER — Encounter (HOSPITAL_COMMUNITY): Payer: Self-pay

## 2018-10-19 DIAGNOSIS — R2689 Other abnormalities of gait and mobility: Secondary | ICD-10-CM

## 2018-10-19 DIAGNOSIS — M6281 Muscle weakness (generalized): Secondary | ICD-10-CM

## 2018-10-19 NOTE — Telephone Encounter (Signed)
Called and LMOVM for Pt to return my call for imaging results tomorrow after 8am

## 2018-10-19 NOTE — Therapy (Signed)
[place], [name], [address] Phone: [phone]   Fax:  [phone]  Physical Therapy Treatment  Patient Details  Name: Nicole Guerrero MRN: [id number] Date of Birth: 12-May-1950 Referring Provider (PT): Pieter Partridge, DO   Encounter Date: 10/19/2018  PT End of Session - 10/19/18 0829    Visit Number  8    Number of Visits  12    Date for PT Re-Evaluation  10/31/18    Authorization Type  [employer] Medicare (no auth required, no visit limit)    Authorization Time Period  09/18/2018-10/31/2018    Authorization - Visit Number  4    Authorization - Number of Visits  10    PT Start Time  0822    PT Stop Time  0904    PT Time Calculation (min)  42 min    Activity Tolerance  Patient tolerated treatment well    Behavior During Therapy  [facility] for tasks assessed/performed       Past Medical History:  Diagnosis Date  . GERD (gastroesophageal reflux disease)   . Viral endocarditis     Past Surgical History:  Procedure Laterality Date  . TUBAL LIGATION      There were no vitals filed for this visit.  Subjective Assessment - 10/19/18 0825    Subjective  Pt reports she is challenged to complete HEP but has been trying to complete in the morning.  Reports plans for some procedure that removes lumbar puncture tnext Tuesday and 4th MRI order not scheduled yet.  Denies pain today.  Does report some pain in bed for the last 3 weeks.    Currently in Pain?  No/denies                       [facility] Adult PT Treatment/Exercise - 10/19/18 0001      Exercises   Exercises  Knee/Hip      Knee/Hip Exercises: Standing   Heel Raises  Both;1 set;20 reps;3 seconds    Heel Raises Limitations  Incline slope    Step Down  Both;2 sets;10 reps;Hand Hold: 2;Step Height: 6"    SLS with Vectors  2x 5 reps Bil LE on foam, 3 way vector    Other Standing Knee Exercises  sidestep 1RT; Tandem stance on foam 10x; UE flexion 3# dowel rod     Other Standing Knee Exercises  Hip Hike: 1x 10 reps, bil UE support      Knee/Hip Exercises: Seated   Sit to Sand  2 sets;10 reps;without UE support   RTB around knee     Knee/Hip Exercises: Supine   Bridges  Both;2 sets;10 reps    Bridges Limitations  5 sec holds, RTB around knee      Knee/Hip Exercises: Sidelying   Hip ABduction  Both;2 sets;10 reps    Hip ABduction Limitations  Cueing to improve form and reduce ER     Clams  2x 10 reps bil LE, 5 sec holds; wiht RTB               PT Short Term Goals - 10/12/18 0831      PT SHORT TERM GOAL #1   Title  Patient will be independent with HEP, updated PRN, to increase Rt LE strength and improve gait and mobility.    Time  2    Period  Weeks    Status  Achieved    Target Date  10/03/18  PT SHORT TERM GOAL #2   Title  Patient will improve Rt LE strength by 1/2 grade throughout to indicate increased functional strength for improved mobiltiy.    Time  3    Period  Weeks    Status  Achieved    Target Date  10/10/18        PT Long Term Goals - 10/12/18 0831      PT LONG TERM GOAL #1   Title  Patient will achieve 1 grade or more increase in Rt LE strength compared to evaluation to indicate significant increase in strength.    Time  6    Period  Weeks    Status  On-going      PT LONG TERM GOAL #2   Title  Patient will perform 5x sit to stand with no eveidence of varus/valgus shift of Rt knee to indicate improved strength to maintain optimal alignment of Rt LE in functional activities.    Time  6    Period  Weeks    Status  On-going      PT LONG TERM GOAL #3   Title  Patient will transition to regular exercise routine and/or walking program to maintain strength and improve overall health/fitness.    Time  6    Period  Weeks    Status  On-going            Plan - 10/19/18 0920    Clinical Impression Statement  Continued session focus with hip and functional strengthening.  Pt continues to require cueing  for proper form with abduction exercises as tendency to compensate wiht hip flexors in standing exercises and ER in sidelying position, instructed UE support to assure correct form with HEP.  This session added theraband resistance for strengthening with sit to stand to reduce knee valgus wiht cueing for form.  No reports of pain through session, was limited by fatigue wiht activities.    Personal Factors and Comorbidities  Age;Fitness    Examination-Activity Limitations  Locomotion Level    Stability/Clinical Decision Making  Stable/Uncomplicated    Clinical Decision Making  Low    Rehab Potential  Good    PT Frequency  2x / week    PT Duration  6 weeks    PT Treatment/Interventions  ADLs/Self Care Home Management;Cryotherapy;Electrical Stimulation;Iontophoresis 4mg /ml Dexamethasone;Moist Heat;Traction;DME Instruction;Gait training;Stair training;Functional mobility training;Therapeutic activities;Therapeutic exercise;Balance training;Neuromuscular re-education;Taping;Passive range of motion;Manual techniques;Patient/family education    PT Next Visit Plan  Continue hip and LE strengthening exercises.  F/u with HEP completion following review and new handout. Progress exercises in standing and add TB for abductor activation with sit<>stand.    PT Home Exercise Plan  Eval: bridge, clamshell, heel raises; 09/20/18: additional RTB with bridge and sidelying clams; 10/02/18: hip hiking  10/10/18:  sidelying hip abduction       Patient will benefit from skilled therapeutic intervention in order to improve the following deficits and impairments:  Abnormal gait, Decreased activity tolerance, Decreased balance, Decreased mobility, Decreased strength, Difficulty walking  Visit Diagnosis: 1. Other abnormalities of gait and mobility   2. Muscle weakness (generalized)        Problem List Patient Active Problem List   Diagnosis Date Noted  . Cystocele, grade 3 10/22/2013  . OSTEOARTHRITIS, SHOULDER  05/19/2009  . SHOULDER PAIN, RIGHT 05/19/2009  . CERVICAL SPONDYLOSIS WITHOUT MYELOPATHY 05/19/2009  . NECK PAIN 05/19/2009   Ihor [place], [place]; [place]  Aldona Lento 10/19/2018, 9:29 AM  [place]  [place] [address] [place], [name], [address] Phone: [phone]   Fax:  [phone]  Name: Nicole Guerrero MRN: [id number] Date of Birth: [birth year]

## 2018-10-19 NOTE — Telephone Encounter (Signed)
-----   Message from Pieter Partridge, DO sent at 10/05/2018  7:12 AM EDT ----- MRI of brain is unremarkable.  The finding on the MRI of cervical spine is stable.  It is still there but no changes/progression.  As previously discussed, I would next like to proceed with LP for CSF analysis of cell count, protein, glucose, oligoclonal bands, IgG index, Lyme, gram stain and culture.

## 2018-10-20 ENCOUNTER — Other Ambulatory Visit: Payer: Self-pay

## 2018-10-20 ENCOUNTER — Telehealth: Payer: Self-pay

## 2018-10-20 ENCOUNTER — Encounter (HOSPITAL_COMMUNITY): Payer: Self-pay

## 2018-10-20 ENCOUNTER — Ambulatory Visit (HOSPITAL_COMMUNITY): Payer: Medicare Other

## 2018-10-20 DIAGNOSIS — R2689 Other abnormalities of gait and mobility: Secondary | ICD-10-CM | POA: Diagnosis not present

## 2018-10-20 DIAGNOSIS — M6281 Muscle weakness (generalized): Secondary | ICD-10-CM

## 2018-10-20 DIAGNOSIS — R29898 Other symptoms and signs involving the musculoskeletal system: Secondary | ICD-10-CM

## 2018-10-20 DIAGNOSIS — R6889 Other general symptoms and signs: Secondary | ICD-10-CM

## 2018-10-20 NOTE — Telephone Encounter (Signed)
Called and advised Pt of results. She has not had MRI T spine and questioned if she should have this. There is an order in the system, however, the location was entered as [facility], not [facility]. I advised Pt I will call and check status and call her back. Per Dr Tomi Likens, he would like her to still haveT spine MRI, preferably prior to LP scheduled on 10/24/18.  Called [facility], spoke with Prentiss Bells. She does not have an opening until 8/24, though will keep her on a list for cx and bring her in earlier if possible. Called Pt and advised her.

## 2018-10-20 NOTE — Telephone Encounter (Signed)
-----   Message from Pieter Partridge, DO sent at 10/05/2018  7:12 AM EDT ----- MRI of brain is unremarkable.  The finding on the MRI of cervical spine is stable.  It is still there but no changes/progression.  As previously discussed, I would next like to proceed with LP for CSF analysis of cell count, protein, glucose, oligoclonal bands, IgG index, Lyme, gram stain and culture.

## 2018-10-20 NOTE — Therapy (Signed)
[place], [name], [address] Phone: [phone]   Fax:  [phone]  Physical Therapy Treatment  Patient Details  Name: Nicole Guerrero MRN: [id number] Date of Birth: Feb 16, 1951 Referring Provider (PT): Pieter Partridge, DO   Encounter Date: 10/20/2018  PT End of Session - 10/20/18 0842    Visit Number  9    Number of Visits  12    Date for PT Re-Evaluation  10/31/18    Authorization Type  [employer] Medicare (no auth required, no visit limit)    Authorization Time Period  09/18/2018-10/31/2018    Authorization - Visit Number  5    Authorization - Number of Visits  10    PT Start Time  1696    PT Stop Time  0903    PT Time Calculation (min)  29 min    Activity Tolerance  Patient tolerated treatment well    Behavior During Therapy  [facility] for tasks assessed/performed       Past Medical History:  Diagnosis Date  . GERD (gastroesophageal reflux disease)   . Viral endocarditis     Past Surgical History:  Procedure Laterality Date  . TUBAL LIGATION      There were no vitals filed for this visit.  Subjective Assessment - 10/20/18 0840    Subjective  Patient reports no pain today and states she thought her appointment was for 8:30. She wants to reschedule her last appointment for Friday next week after her lumbar puncture on Tuesday.    Limitations  Standing;Walking    Currently in Pain?  No/denies        [facility] Adult PT Treatment/Exercise - 10/20/18 0001      Knee/Hip Exercises: Stretches   Other Knee/Hip Stretches  lumbar extension in standing at counter, 10x 5 sec holds      Knee/Hip Exercises: Standing   Heel Raises  Both;1 set;20 reps   on slant board   Heel Raises Limitations  1x 20 reps on decline for toe raises    Hip Abduction  Both;Knee straight;3 sets;5 reps    Abduction Limitations  red TB at ankles    Hip Extension  Both;Knee straight;3 sets;5 reps    Extension Limitations  red TB at ankles    Lateral Step Up  Both;1 set;20 reps;Hand Hold: 2;Step Height: 6"    Step Down  Both;2 sets;10 reps;Hand Hold: 2;Step Height: 6"    Other Standing Knee Exercises  Side step with red TB at knees, 3x 15' RT      Knee/Hip Exercises: Seated   Sit to Sand  2 sets;10 reps;without UE support   red TB at knees for hip abduction      PT Education - 10/20/18 0903    Education Details  Educated on exercises throughout and discussed remaining appointments and POC for re-assessment on final visit.    Person(s) Educated  Patient    Methods  Explanation    Comprehension  Verbalized understanding;Returned demonstration       PT Short Term Goals - 10/12/18 0831      PT SHORT TERM GOAL #1   Title  Patient will be independent with HEP, updated PRN, to increase Rt LE strength and improve gait and mobility.    Time  2    Period  Weeks    Status  Achieved    Target Date  10/03/18      PT SHORT TERM GOAL #2   Title  Patient will improve Rt LE strength by 1/2 grade throughout to indicate increased functional strength for improved mobiltiy.    Time  3    Period  Weeks    Status  Achieved    Target Date  10/10/18        PT Long Term Goals - 10/12/18 0831      PT LONG TERM GOAL #1   Title  Patient will achieve 1 grade or more increase in Rt LE strength compared to evaluation to indicate significant increase in strength.    Time  6    Period  Weeks    Status  On-going      PT LONG TERM GOAL #2   Title  Patient will perform 5x sit to stand with no eveidence of varus/valgus shift of Rt knee to indicate improved strength to maintain optimal alignment of Rt LE in functional activities.    Time  6    Period  Weeks    Status  On-going      PT LONG TERM GOAL #3   Title  Patient will transition to regular exercise routine and/or walking program to maintain strength and improve overall health/fitness.    Time  6    Period  Weeks    Status  On-going         Plan - 10/20/18 0843    Clinical  Impression Statement  Patient was late for therapy this session and had mistaken the time for the appointment with a different date. She was able to progress repetitions with exercises and demonstrated improved eccentric quad control during step down on Rt and Lt LE. She maintained good mini-squat form with side stepping and good form with sit to stands to prevent bil knee valgus. Next session would benefit from advancement to green theraband. Visual observation noted pt has improved gait quality at start and end of session with reduced trendelenburg. She will continue to benefit from skilled PT interventions to address impairments and further progress function for improved QOL.    Personal Factors and Comorbidities  Age;Fitness    Examination-Activity Limitations  Locomotion Level    Stability/Clinical Decision Making  Stable/Uncomplicated    Rehab Potential  Good    PT Frequency  2x / week    PT Duration  6 weeks    PT Treatment/Interventions  ADLs/Self Care Home Management;Cryotherapy;Electrical Stimulation;Iontophoresis 4mg /ml Dexamethasone;Moist Heat;Traction;DME Instruction;Gait training;Stair training;Functional mobility training;Therapeutic activities;Therapeutic exercise;Balance training;Neuromuscular re-education;Taping;Passive range of motion;Manual techniques;Patient/family education    PT Next Visit Plan  Continue hip and LE strengthening exercises.  Progress to green TB for exercises with resistance. Progress exercises in standing and add TB for abductor activation with sit<>stand.    PT Home Exercise Plan  Eval: bridge, clamshell, heel raises; 09/20/18: additional RTB with bridge and sidelying clams; 10/02/18: hip hiking  10/10/18:  sidelying hip abduction    Consulted and Agree with Plan of Care  Patient       Patient will benefit from skilled therapeutic intervention in order to improve the following deficits and impairments:  Abnormal gait, Decreased activity tolerance, Decreased balance,  Decreased mobility, Decreased strength, Difficulty walking  Visit Diagnosis: 1. Other abnormalities of gait and mobility   2. Muscle weakness (generalized)        Problem List Patient Active Problem List   Diagnosis Date Noted  . Cystocele, grade 3 10/22/2013  . OSTEOARTHRITIS, SHOULDER 05/19/2009  . SHOULDER PAIN, RIGHT 05/19/2009  . CERVICAL SPONDYLOSIS WITHOUT MYELOPATHY 05/19/2009  . NECK PAIN  05/19/2009    Kipp Brood, PT, DPT, [facility] Physical Therapist with [facility]  10/20/2018 9:07 AM    [place] [address] [place], [name], [address] Phone: [phone]   Fax:  [phone]  Name: LTANYA BAYLEY MRN: [id number] Date of Birth: 1950-09-24

## 2018-10-23 ENCOUNTER — Other Ambulatory Visit: Payer: Self-pay

## 2018-10-23 ENCOUNTER — Ambulatory Visit (HOSPITAL_COMMUNITY): Payer: Medicare Other | Attending: Neurology | Admitting: Physical Therapy

## 2018-10-23 DIAGNOSIS — R2689 Other abnormalities of gait and mobility: Secondary | ICD-10-CM | POA: Diagnosis not present

## 2018-10-23 DIAGNOSIS — M6281 Muscle weakness (generalized): Secondary | ICD-10-CM | POA: Diagnosis present

## 2018-10-23 NOTE — Therapy (Signed)
[facility] [place], [name], [address] Phone: [phone]   Fax:  [phone]  Physical Therapy Treatment  Patient Details  Name: Nicole Guerrero MRN: [id number] Date of Birth: [birth year] Referring Provider (PT): Pieter Partridge, DO   Encounter Date: 10/23/2018  PT End of Session - 10/23/18 0925    Visit Number  10   PN completed visit #5   Number of Visits  12    Date for PT Re-Evaluation  10/31/18    Authorization Type  [employer] Medicare (no auth required, no visit limit)    Authorization Time Period  09/18/2018-10/31/2018    Authorization - Visit Number  6    Authorization - Number of Visits  10    PT Start Time  [phone]    PT Stop Time  0915    PT Time Calculation (min)  40 min    Activity Tolerance  Patient tolerated treatment well    Behavior During Therapy  [facility] for tasks assessed/performed       Past Medical History:  Diagnosis Date  . GERD (gastroesophageal reflux disease)   . Viral endocarditis     Past Surgical History:  Procedure Laterality Date  . TUBAL LIGATION      There were no vitals filed for this visit.  Subjective Assessment - 10/23/18 0841    Subjective  Pt states she goes to get a lumbar puncture tomorrow.  Currently without pain or issues.                       [facility] PT Treatment/Exercise - 10/23/18 0001      Knee/Hip Exercises: Standing   Heel Raises  Both;1 set;20 reps    Heel Raises Limitations  1x 20 reps on decline for toe raises    Hip Abduction  Both;Knee straight;3 sets;5 reps    Abduction Limitations  red TB at ankles    Hip Extension  Both;Knee straight;3 sets;5 reps    Extension Limitations  red TB at ankles    Lateral Step Up  Both;1 set;20 reps;Hand Hold: 2;Step Height: 6"    Step Down  Both;2 sets;10 reps;Hand Hold: 2;Step Height: 6"    SLS with Vectors  2x 5 reps Bil LE on foam, 3 way vector    Other Standing Knee Exercises  Side step with red TB at knees,  3x 15' RT    Other Standing Knee Exercises  Hip Hike: 1x 20 reps, bil UE support      Knee/Hip Exercises: Seated   Sit to Sand  2 sets;10 reps;without UE support               PT Short Term Goals - 10/12/18 0831      PT SHORT TERM GOAL #1   Title  Patient will be independent with HEP, updated PRN, to increase Rt LE strength and improve gait and mobility.    Time  2    Period  Weeks    Status  Achieved    Target Date  10/03/18      PT SHORT TERM GOAL #2   Title  Patient will improve Rt LE strength by 1/2 grade throughout to indicate increased functional strength for improved mobiltiy.    Time  3    Period  Weeks    Status  Achieved    Target Date  10/10/18        PT Long Term Goals - 10/12/18  0831      PT LONG TERM GOAL #1   Title  Patient will achieve 1 grade or more increase in Rt LE strength compared to evaluation to indicate significant increase in strength.    Time  6    Period  Weeks    Status  On-going      PT LONG TERM GOAL #2   Title  Patient will perform 5x sit to stand with no eveidence of varus/valgus shift of Rt knee to indicate improved strength to maintain optimal alignment of Rt LE in functional activities.    Time  6    Period  Weeks    Status  On-going      PT LONG TERM GOAL #3   Title  Patient will transition to regular exercise routine and/or walking program to maintain strength and improve overall health/fitness.    Time  6    Period  Weeks    Status  On-going            Plan - 10/23/18 0488    Clinical Impression Statement  continued with established POC.  Cues needed for form and stabilization with therex.  Noted hyperextension of Rt knee,  weakness and difficulty keeping hips level with lateral step down and maintaining hold time with vectors.  No complaints of pain during session, however did require 2 short seated rest breaks due to fatigue.  Unable to complete more than 5 reps at time with hip abd/extension due to weakness  therefore did not increase to green theraband this session.    Personal Factors and Comorbidities  Age;Fitness    Examination-Activity Limitations  Locomotion Level    Stability/Clinical Decision Making  Stable/Uncomplicated    Rehab Potential  Good    PT Frequency  2x / week    PT Duration  6 weeks    PT Treatment/Interventions  ADLs/Self Care Home Management;Cryotherapy;Electrical Stimulation;Iontophoresis 4mg /ml Dexamethasone;Moist Heat;Traction;DME Instruction;Gait training;Stair training;Functional mobility training;Therapeutic activities;Therapeutic exercise;Balance training;Neuromuscular re-education;Taping;Passive range of motion;Manual techniques;Patient/family education    PT Next Visit Plan  Continue hip and LE strengthening exercises.  Progress to green TB for exercises with resistance as able to complete full 10 reps without rest on each side.    PT Home Exercise Plan  Eval: bridge, clamshell, heel raises; 09/20/18: additional RTB with bridge and sidelying clams; 10/02/18: hip hiking  10/10/18:  sidelying hip abduction    Consulted and Agree with Plan of Care  Patient       Patient will benefit from skilled therapeutic intervention in order to improve the following deficits and impairments:  Abnormal gait, Decreased activity tolerance, Decreased balance, Decreased mobility, Decreased strength, Difficulty walking  Visit Diagnosis: 1. Other abnormalities of gait and mobility   2. Muscle weakness (generalized)        Problem List Patient Active Problem List   Diagnosis Date Noted  . Cystocele, grade 3 10/22/2013  . OSTEOARTHRITIS, SHOULDER 05/19/2009  . SHOULDER PAIN, RIGHT 05/19/2009  . CERVICAL SPONDYLOSIS WITHOUT MYELOPATHY 05/19/2009  . NECK PAIN 05/19/2009   Teena Irani, PTA/CLT [phone]  Teena Irani 10/23/2018, 9:33 AM  [facility] [address] [place], [name], [address] Phone: [phone]   Fax:   [phone]  Name: SHARRON PETRUSKA MRN: [id number] Date of Birth: 26-Jul-1950

## 2018-10-24 ENCOUNTER — Ambulatory Visit
Admission: RE | Admit: 2018-10-24 | Discharge: 2018-10-24 | Disposition: A | Discharge status: Home / Self Care | Payer: Medicare Other | Source: Ambulatory Visit | Attending: Neurology | Admitting: Neurology

## 2018-10-24 VITALS — BP 150/73 | HR 57

## 2018-10-24 DIAGNOSIS — M542 Cervicalgia: Secondary | ICD-10-CM

## 2018-10-24 DIAGNOSIS — N889 Noninflammatory disorder of cervix uteri, unspecified: Secondary | ICD-10-CM

## 2018-10-24 NOTE — Progress Notes (Signed)
One SST tube of blood drawn from left [facility] space without difficulty for LP labs by Lavenia Atlas, RN

## 2018-10-24 NOTE — Discharge Instructions (Signed)

## 2018-10-25 ENCOUNTER — Ambulatory Visit (HOSPITAL_COMMUNITY): Payer: Medicare Other

## 2018-10-27 ENCOUNTER — Encounter (HOSPITAL_COMMUNITY): Payer: Self-pay

## 2018-10-27 ENCOUNTER — Other Ambulatory Visit: Payer: Self-pay

## 2018-10-27 ENCOUNTER — Encounter

## 2018-10-27 ENCOUNTER — Ambulatory Visit (HOSPITAL_COMMUNITY): Payer: Medicare Other

## 2018-10-27 ENCOUNTER — Other Ambulatory Visit: Payer: Medicare Other

## 2018-10-27 DIAGNOSIS — R2689 Other abnormalities of gait and mobility: Secondary | ICD-10-CM

## 2018-10-27 DIAGNOSIS — M6281 Muscle weakness (generalized): Secondary | ICD-10-CM

## 2018-10-27 NOTE — Therapy (Signed)
[facility] [address] [place], [name], [address] Phone: [phone]   Fax:  [phone]   PHYSICAL THERAPY DISCHARGE SUMMARY  Visits from Start of Care: 11  Current functional level related to goals / functional outcomes: See below   Remaining deficits: See below   Education / Equipment: See below  Plan: Patient agrees to discharge.  Patient goals were partially met. Patient is being discharged due to lack of progress.  ?????       Physical Therapy Treatment  Patient Details  Name: Nicole Guerrero MRN: [id number] Date of Birth: June 03, 1950 Referring Provider (PT): Pieter Partridge, DO   Encounter Date: 10/27/2018  PT End of Session - 10/27/18 1315    Visit Number  11   PN completed visit #5   Number of Visits  12    Date for PT Re-Evaluation  10/31/18    Authorization Type  [employer] Medicare (no auth required, no visit limit)    Authorization Time Period  09/18/2018-10/31/2018    Authorization - Visit Number  7    Authorization - Number of Visits  10    PT Start Time  3532    PT Stop Time  1338    PT Time Calculation (min)  23 min    Activity Tolerance  Patient tolerated treatment well    Behavior During Therapy  WFL for tasks assessed/performed       Past Medical History:  Diagnosis Date  . GERD (gastroesophageal reflux disease)   . Viral endocarditis     Past Surgical History:  Procedure Laterality Date  . TUBAL LIGATION      There were no vitals filed for this visit.  Subjective Assessment - 10/27/18 1319    Subjective  Pt reports things are much better, some weakness still but pt is able to manage. Pt reports awaiting lumbar puncture results and another MRI for thoracic spine ordered.    Limitations  Standing;Walking    Currently in Pain?  No/denies         [facility] PT Assessment - 10/27/18 0001      Assessment   Medical Diagnosis  Rt LE Weakness    Referring Provider (PT)  Pieter Partridge, DO    Prior Therapy   Yes earlier this year      Cognition   Overall Cognitive Status  Within Functional Limits for tasks assessed      Observation/Other Assessments   Focus on Therapeutic Outcomes (FOTO)   n/a      Strength   Right Hip Flexion  4/5   was 4   Right Hip Extension  3+/5   was 4-   Right Hip ABduction  4/5   was 4   Left Hip Flexion  4+/5   was 4+   Left Hip Extension  4/5   was 4+   Left Hip ABduction  4+/5   was 4   Right Knee Flexion  4/5    Right Knee Extension  4+/5    Left Knee Flexion  5/5    Left Knee Extension  5/5    Right Ankle Dorsiflexion  4/5   was 4-   Right Ankle Plantar Flexion  4/5   as 4   Left Ankle Dorsiflexion  5/5   was 5   Left Ankle Plantar Flexion  5/5   was 5     Transfers   Five time sit to stand comments   9.07 sec, from chair,  no UE use   was 9.24 sec   Comments  pt's Rt knee with valgus shift upon standing      Ambulation/Gait   Ambulation/Gait  Yes    Ambulation/Gait Assistance  7: Independent    Assistive device  None    Gait Pattern  Step-through pattern;Decreased hip/knee flexion - right;Decreased hip/knee flexion - left;Trendelenburg   R knee valgus   Ambulation Surface  Level;Indoor    Gait velocity  1.37 m/s          PT Education - 10/27/18 1320    Education Details  Discharged to HEP, performing a few exercises daily, walking/individual exercise plan in order to maintain activity level, issued GTB and educated to use once RTB becomes easy    Person(s) Educated  Patient    Methods  Explanation;Handout    Comprehension  Verbalized understanding       PT Short Term Goals - 10/27/18 1329      PT SHORT TERM GOAL #1   Title  Patient will be independent with HEP, updated PRN, to increase Rt LE strength and improve gait and mobility.    Baseline  8/7: pt reports compliance    Time  2    Period  Weeks    Status  Achieved    Target Date  10/03/18      PT SHORT TERM GOAL #2   Title  Patient will improve Rt LE strength by 1/2  grade throughout to indicate increased functional strength for improved mobiltiy.    Baseline  8/7: see MMT    Time  3    Period  Weeks    Status  Achieved    Target Date  10/10/18        PT Long Term Goals - 10/27/18 1329      PT LONG TERM GOAL #1   Title  Patient will achieve 1 grade or more increase in Rt LE strength compared to evaluation to indicate significant increase in strength.    Baseline  8/7: see MMT    Time  6    Period  Weeks    Status  On-going      PT LONG TERM GOAL #2   Title  Patient will perform 5x sit to stand with no eveidence of varus/valgus shift of Rt knee to indicate improved strength to maintain optimal alignment of Rt LE in functional activities.    Baseline  8/7: 9.07 sec with valgus    Time  6    Period  Weeks    Status  On-going      PT LONG TERM GOAL #3   Title  Patient will transition to regular exercise routine and/or walking program to maintain strength and improve overall health/fitness.    Baseline  8/7: pt reports performs physical tasks at home, no exercise routine/walking program    Time  6    Period  Weeks    Status  Achieved            Plan - 10/27/18 1316    Clinical Impression Statement  Pt dues for reassessment this date. Subjectively, pt reports improvements but still notices deficits and agrees to discharge. Objectively, pt with relatively equal strength bil per MMT. Pt with minimal improvements in 5x STS speed and continues to demonstrates closed kinetic chain weakness per R knee valgus noted. Pt demonstrates improvements in gait speed ambulating at 1.37 m/s with gait deficits noted due to weakness on RLE. Pt is appropriate to discharge  this date due to lack of progress since progress note, but has maintained gains made since initial start to therapy. Pt is still awaiting lumbar puncture test results and reports MRI for thoracic spine has been ordered by her doctor for further investigation. This therapist updated pt's HEP this  date, educated her on performance, issues additional resistance bands and educated pt on getting new referral if needs arise.    Personal Factors and Comorbidities  Age;Fitness    Examination-Activity Limitations  Locomotion Level    Stability/Clinical Decision Making  Stable/Uncomplicated    Rehab Potential  Good    PT Frequency  2x / week    PT Duration  6 weeks    PT Treatment/Interventions  ADLs/Self Care Home Management;Cryotherapy;Electrical Stimulation;Iontophoresis 62m/ml Dexamethasone;Moist Heat;Traction;DME Instruction;Gait training;Stair training;Functional mobility training;Therapeutic activities;Therapeutic exercise;Balance training;Neuromuscular re-education;Taping;Passive range of motion;Manual techniques;Patient/family education    PT Next Visit Plan  Discharged to HEP    PT Home Exercise Plan  Eval: bridge, clamshell, heel raises; 09/20/18: additional RTB with bridge and sidelying clams; 10/02/18: hip hiking  10/10/18:  sidelying hip abduction; 8/7: standing hip abd/ext with RTB, sidestepping with counter support RTB, STS, calf raises    Consulted and Agree with Plan of Care  Patient       Patient will benefit from skilled therapeutic intervention in order to improve the following deficits and impairments:  Abnormal gait, Decreased activity tolerance, Decreased balance, Decreased mobility, Decreased strength, Difficulty walking  Visit Diagnosis: 1. Other abnormalities of gait and mobility   2. Muscle weakness (generalized)        Problem List Patient Active Problem List   Diagnosis Date Noted  . Cystocele, grade 3 10/22/2013  . OSTEOARTHRITIS, SHOULDER 05/19/2009  . SHOULDER PAIN, RIGHT 05/19/2009  . CERVICAL SPONDYLOSIS WITHOUT MYELOPATHY 05/19/2009  . NECK PAIN 05/19/2009    TTalbot GrumblingPT, DPT 10/27/18, 1:59 PM [id number]SM[address] [place] [address]Phone: [phone]  Fax:   [phone] Name: Nicole SELKIRKMRN: [id number] of Birth: [birth year]

## 2018-10-30 ENCOUNTER — Ambulatory Visit
Admission: RE | Admit: 2018-10-30 | Discharge: 2018-10-30 | Disposition: A | Discharge status: Home / Self Care | Payer: Medicare Other | Source: Ambulatory Visit | Attending: Neurology | Admitting: Neurology

## 2018-10-30 DIAGNOSIS — R6889 Other general symptoms and signs: Secondary | ICD-10-CM

## 2018-10-30 DIAGNOSIS — R29898 Other symptoms and signs involving the musculoskeletal system: Secondary | ICD-10-CM

## 2018-10-30 MED ORDER — GADOBENATE DIMEGLUMINE 529 MG/ML IV SOLN
12.0000 mL | Freq: Once | INTRAVENOUS | Status: AC | PRN
  Administered 2018-10-30: 15:00:00 12 mL via INTRAVENOUS

## 2018-10-31 LAB — CSF CULTURE
MICRO NUMBER:: 734783
Result:: NO GROWTH

## 2018-10-31 LAB — CSF CELL COUNT WITH DIFFERENTIAL
RBC Count, CSF: 1 cells/uL (ref 0–10)
WBC, CSF: 0 cells/uL (ref 0–5)

## 2018-10-31 LAB — PROTEIN, CSF: Total Protein, CSF: 25 mg/dL (ref 15–60)

## 2018-10-31 LAB — CNS IGG SYNTHESIS RATE, CSF+BLOOD
Albumin Serum: 4.4 g/dL (ref 3.2–4.6)
Albumin, CSF: 10 mg/dL (ref 8.0–42.0)
CNS-IgG Synthesis Rate: 0.6 mg/24 h (ref <=3.3)
IgG (Immunoglobin G), Serum: 809 mg/dL (ref 600–1540)
IgG Total CSF: 1.6 mg/dL (ref 0.8–7.7)
IgG-Index: 0.87 — ABNORMAL HIGH (ref <0.66)

## 2018-10-31 LAB — BORRELIA SPECIES DNA, FLUID, PCR: B. burgdorferi DNA: NOT DETECTED

## 2018-10-31 LAB — OLIGOCLONAL BANDS, CSF + SERM

## 2018-10-31 LAB — CSF CULTURE W GRAM STAIN: SPECIMEN QUALITY:: ADEQUATE

## 2018-10-31 LAB — GLUCOSE, CSF: Glucose, CSF: 65 mg/dL (ref 40–80)

## 2018-11-03 ENCOUNTER — Other Ambulatory Visit: Payer: Medicare Other

## 2018-11-13 ENCOUNTER — Other Ambulatory Visit: Payer: Medicare Other

## 2018-11-13 NOTE — Progress Notes (Signed)
NEUROLOGY FOLLOW UP OFFICE NOTE  Nicole Guerrero [id number]  HISTORY OF PRESENT ILLNESS: Nicole Guerrero is a 68 year old right-handed African American woman with GERD and remote history of viral endocarditis who follows up for cervical spinal cord lesion.  UPDATE: Workup since last visit: 10/03/18 MRI Brain w/wo:  Mild nonspecific chronic white matter changes, stable compared to 10/28/17. 10/03/18 MRI Cervical spine w/wo:  nonenhancing abnormal signal in right cord at C5 and C6, stable since 2015. 10/30/18 MRI Thoracic spine w/wo:  Normal appearing cord.  Disc protrusion at T8-9 with mild spinal stenosis. 10/13/18 MRI Lumbar spine w/wo:  Normal 10/24/18 CSF:  Cell count 0, glucose 65, protein 25, gram stain & culture negative, > 5 oligoclonal bands in both CSF and serum, elevated IgG index 0.87, Lyme negative 09/05/18 Serum:  ANA negative, sed rate 23, Lyme negative, NMO IgG negative.  She is doing better since PT.  Her previous limp has overall resolved.  She still sometimes has shooting pain in right leg and soreness in right shoulder.  HISTORY: She is suffered from right-sided neck pain since at least 2013. The pain radiates on the right, from the shoulder up to the right side of her occipital region. It is exacerbated with movement of her head to the right. There is no shooting pain down the arm nor numbness involving the arms. She denies any numbness in the legs. She did report cramping on the right leg and right arm, which has since resolved after starting vitamin D supplements 4 vitamin D deficiency. Occasionally, she notes soreness in the right hip and her right leg as a sensation that it's a little weaker. She was treated by a local neurologist in [place] around that time. She reports an MRI of the brain at that time, but results are not available. She had undergone physical therapy which helped. More recently, the neck pain got worse. She had an MRI of the cervical spine without contrast  performed on 06/05/13, which showed hyperintensity noted in the right lateral cord at the C5-6 level, measuring 5 mm on axial and 18 mm on sagittal images, not present on prior scan from 04/25/09. Mild to moderate stenosis is noted at that level as well. She was referred to Dr. Hal Neer of neurosurgery, who thought evaluation by neurology was more appropriate. I saw her once in consultation on 08/06/13.  At that time, I asked her to obtain her previous MRIs on CD and bring to me for review.  She underwent blood work.  B12 was 690, Lyme antibodies negative.  ANA was borderline positive 1:40 with speckled pattern.  Sed rate 8, RF negative and ENA 9 panel (ds DNA ab, ribosomal P Protein ab, SSA/SSB antibodies, centromere ab, ENA SM ab Ser-aCnc, SM/RNP, Scl-70 ab and anti-Jo) was negative.  Plan was to have her follow up in 4 weeks and proceed with LP but she never brought her MRIs and never followed up.  She subsequently followed up with neurologist Dr. Merlene Laughter in 2019 for continued right sided weakness.  MRI of brain with and without contrast performed on 10/28/17 was personally reviewed and was normal. MRI of cervical spine with and without contrast performed on same day was personally reviewed and demonstrates far righ T2 cord signal abnormalmity at C5 and C6 extending 1.7 cm without enhancement (stable compared to prior imaging from March 2015) as well as subtle posterior T2 signal changes at T1 cord level. With contrast, findings more consistent with chronic demyelination or  malacia and not neoplasm.  She subsequently underwent 3 day course of SoluMedrol in September 2019 which was ineffective.  Dr. Merlene Laughter recommended blood work and lumbar puncture for CSF analysis but again patient never followed up to have these tests performed.  In April-May 2020, she reports worsening right sided weakness and polyarthralgia including the right knee, right hip and both shoulders.  She was found to have osteoarthritis in the  right knee.  She denies history of head or neck injury.  She states that her niece and brother have multiple sclerosis.  PAST MEDICAL HISTORY: Past Medical History:  Diagnosis Date  . GERD (gastroesophageal reflux disease)   . Viral endocarditis     MEDICATIONS: Current Outpatient Medications on File Prior to Visit  Medication Sig Dispense Refill  . Cholecalciferol (VITAMIN D) 2000 UNITS CAPS Take 1 capsule by mouth every morning.    . Multiple Vitamin (MULTIVITAMIN WITH MINERALS) TABS Take 1 tablet by mouth every morning.    . nitrofurantoin, macrocrystal-monohydrate, (MACROBID) 100 MG capsule TAKE ONE CAPSULE BY MOUTH TWICE A DAY 14 capsule 1  . raNITIdine HCl (ACID REDUCER PO) Take by mouth as needed.     No current facility-administered medications on file prior to visit.     ALLERGIES: No Known Allergies  FAMILY HISTORY: Family History  Problem Relation Age of Onset  . Diabetes Other   . Cancer Other   . Hypertension Other    SOCIAL HISTORY: Social History   Socioeconomic History  . Marital status: Divorced    Spouse name: Not on file  . Number of children: Not on file  . Years of education: Not on file  . Highest education level: Not on file  Occupational History  . Not on file  Social Needs  . Financial resource strain: Not on file  . Food insecurity    Worry: Not on file    Inability: Not on file  . Transportation needs    Medical: Not on file    Non-medical: Not on file  Tobacco Use  . Smoking status: Former Smoker    Packs/day: 0.03    Years: 6.00    Pack years: 0.18    Types: Cigarettes    Quit date: 03/22/1997    Years since quitting: 21.6  . Smokeless tobacco: Never Used  Substance and Sexual Activity  . Alcohol use: No  . Drug use: No  . Sexual activity: Not Currently  Lifestyle  . Physical activity    Days per week: Not on file    Minutes per session: Not on file  . Stress: Not on file  Relationships  . Social Herbalist  on phone: Not on file    Gets together: Not on file    Attends religious service: Not on file    Active member of club or organization: Not on file    Attends meetings of clubs or organizations: Not on file    Relationship status: Not on file  . Intimate partner violence    Fear of current or ex partner: Not on file    Emotionally abused: Not on file    Physically abused: Not on file    Forced sexual activity: Not on file  Other Topics Concern  . Not on file  Social History Narrative  . Not on file    REVIEW OF SYSTEMS: Constitutional: No fevers, chills, or sweats, no generalized fatigue, change in appetite Eyes: No visual changes, double vision, eye pain Ear,  nose and throat: No hearing loss, ear pain, nasal congestion, sore throat Cardiovascular: No chest pain, palpitations Respiratory:  No shortness of breath at rest or with exertion, wheezes GastrointestinaI: No nausea, vomiting, diarrhea, abdominal pain, fecal incontinence Genitourinary:  No dysuria, urinary retention or frequency Musculoskeletal:  No neck pain, back pain Integumentary: No rash, pruritus, skin lesions Neurological: as above Psychiatric: No depression, insomnia, anxiety Endocrine: No palpitations, fatigue, diaphoresis, mood swings, change in appetite, change in weight, increased thirst Hematologic/Lymphatic:  No purpura, petechiae. Allergic/Immunologic: no itchy/runny eyes, nasal congestion, recent allergic reactions, rashes  PHYSICAL EXAM: Blood pressure (!) 149/76, pulse 66, temperature 98.6 F (37 C), height 5\' 2"  (1.575 m), weight 132 lb 12.8 oz (60.2 kg), SpO2 99 %. General: No acute distress.  Patient appears well-groomed.   Head:  Normocephalic/atraumatic  IMPRESSION: 1.  Cervical spinal cord lesion, likely chronic demyeliation.  She does not meet the criteria for MS.  Mild white matter findings in brain are more consistent with small vessel disease.  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  Her current right sided symptoms are likely residual from the chronic demyelination.  Therefore, I would defer disease modifying therapy and continue to monitor.  PLAN: 1.  We will continue to monitor.  She will follow up in 6 months and we can repeat MRI in 2 years (sooner if she experiences new/worsening symptoms)  15 minutes spent face to face with patient, 100% spent discussing results, diagnosis and management   Metta Clines, DO  CC:  Sharilyn Sites, MD

## 2018-11-14 ENCOUNTER — Encounter: Payer: Self-pay | Admitting: Neurology

## 2018-11-14 ENCOUNTER — Other Ambulatory Visit: Payer: Self-pay

## 2018-11-14 ENCOUNTER — Ambulatory Visit (INDEPENDENT_AMBULATORY_CARE_PROVIDER_SITE_OTHER): Payer: Medicare Other | Admitting: Neurology

## 2018-11-14 VITALS — BP 149/76 | HR 66 | Temp 98.6°F | Ht 62.0 in | Wt 132.8 lb

## 2018-11-14 DIAGNOSIS — G379 Demyelinating disease of central nervous system, unspecified: Secondary | ICD-10-CM

## 2018-11-14 DIAGNOSIS — G959 Disease of spinal cord, unspecified: Secondary | ICD-10-CM | POA: Diagnosis not present

## 2018-11-14 NOTE — Patient Instructions (Signed)
At this time, I would not say you have multiple sclerosis.  I would call it a "clinically isolated syndrome".  I think the right sided symptoms are likely residual from several years ago.  Testing shows no new or active findings in brain or spinal cord.  I would continue to monitor.  Follow up in 6 months.

## 2019-02-23 ENCOUNTER — Other Ambulatory Visit (HOSPITAL_COMMUNITY): Payer: Self-pay | Admitting: Obstetrics

## 2019-02-23 DIAGNOSIS — Z1231 Encounter for screening mammogram for malignant neoplasm of breast: Secondary | ICD-10-CM

## 2019-03-08 ENCOUNTER — Other Ambulatory Visit: Payer: Self-pay

## 2019-03-08 ENCOUNTER — Ambulatory Visit (HOSPITAL_COMMUNITY)
Admission: RE | Admit: 2019-03-08 | Discharge: 2019-03-08 | Disposition: A | Discharge status: Home / Self Care | Payer: Medicare Other | Source: Ambulatory Visit | Attending: Obstetrics | Admitting: Obstetrics

## 2019-03-08 DIAGNOSIS — Z1231 Encounter for screening mammogram for malignant neoplasm of breast: Secondary | ICD-10-CM | POA: Insufficient documentation

## 2019-03-12 ENCOUNTER — Other Ambulatory Visit: Payer: Self-pay | Admitting: Obstetrics

## 2019-03-26 ENCOUNTER — Other Ambulatory Visit: Payer: Self-pay | Admitting: Obstetrics

## 2019-04-05 DIAGNOSIS — M25511 Pain in right shoulder: Secondary | ICD-10-CM | POA: Diagnosis not present

## 2019-04-05 DIAGNOSIS — Z6824 Body mass index (BMI) 24.0-24.9, adult: Secondary | ICD-10-CM | POA: Diagnosis not present

## 2019-04-05 DIAGNOSIS — M1991 Primary osteoarthritis, unspecified site: Secondary | ICD-10-CM | POA: Diagnosis not present

## 2019-04-05 DIAGNOSIS — Z1389 Encounter for screening for other disorder: Secondary | ICD-10-CM | POA: Diagnosis not present

## 2019-04-05 DIAGNOSIS — Z20828 Contact with and (suspected) exposure to other viral communicable diseases: Secondary | ICD-10-CM | POA: Diagnosis not present

## 2019-04-05 DIAGNOSIS — G379 Demyelinating disease of central nervous system, unspecified: Secondary | ICD-10-CM | POA: Diagnosis not present

## 2019-04-05 DIAGNOSIS — M545 Low back pain: Secondary | ICD-10-CM | POA: Diagnosis not present

## 2019-04-05 DIAGNOSIS — R7309 Other abnormal glucose: Secondary | ICD-10-CM | POA: Diagnosis not present

## 2019-05-03 ENCOUNTER — Other Ambulatory Visit: Payer: Self-pay

## 2019-05-03 ENCOUNTER — Ambulatory Visit: Payer: Medicare PPO | Attending: Family

## 2019-05-03 DIAGNOSIS — Z23 Encounter for immunization: Secondary | ICD-10-CM | POA: Insufficient documentation

## 2019-05-03 NOTE — Progress Notes (Signed)
   Covid-19 Vaccination Clinic  Name:  Nicole Guerrero    MRN: [id number] DOB: 1951/02/27  05/03/2019  [name] was observed post Covid-19 immunization for 15 minutes without incidence. She was provided with Vaccine Information Sheet and instruction to access the V-Safe system.   [name] was instructed to call 911 with any severe reactions post vaccine: Marland Kitchen Difficulty breathing  . Swelling of your face and throat  . A fast heartbeat  . A bad rash all over your body  . Dizziness and weakness    Immunizations Administered    Name Date Dose VIS Date Route   Moderna COVID-19 Vaccine 05/03/2019 12:53 PM 0.5 mL 02/20/2019 Intramuscular   Manufacturer: Moderna   Lot: [id number]   [id number]

## 2019-05-15 NOTE — Progress Notes (Signed)
Virtual Visit via Video Note The purpose of this virtual visit is to provide medical care while limiting exposure to the novel coronavirus.    Consent was obtained for video visit:  Yes.   Answered questions that patient had about telehealth interaction:  Yes.   I discussed the limitations, risks, security and privacy concerns of performing an evaluation and management service by telemedicine. I also discussed with the patient that there may be a patient responsible charge related to this service. The patient expressed understanding and agreed to proceed.  Pt location: Home Physician Location: office Name of referring provider:  Sharilyn Sites, MD I connected with Nicole Guerrero at patients initiation/request on 05/17/2019 at  9:50 AM EST by video enabled telemedicine application and verified that I am speaking with the correct person using two identifiers. Pt MRN:  [id number] Pt DOB:  February 24, 1951 Video Participants:  Eston Mould Ricke   History of Present Illness:  Nicole Guerrero is a 69 year old right-handed African American woman with GERD and remote history of viral endocarditis who follows up for cervical spinal cord lesion.  UPDATE: Overall strength and pain better with physical therapy.  However, she reports new sensory symptoms and pain.  She reports a "tight" sensation across her lower back and tingling up her spine.  She notices this when she lays supine.  This is concerning for her.Marland Kitchen  HISTORY: She is suffered from right-sided neck painsince at least 2013. The pain radiates on the right, from the shoulder up to the right side of her occipital region. It is exacerbated with movement of her head to the right. There is no shooting pain down the arm nor numbness involving the arms. She denies any numbness in the legs. She did report cramping on the right leg and right arm, which has since resolved after starting vitamin D supplements 4 vitamin D deficiency. Occasionally, she notes soreness  in the right hip and her right leg as a sensation that it's a little weaker. She was treated by a local neurologist in [place] around that time. She reports an MRI of the brain at that time, but results are not available. She had undergone physical therapy which helped. More recently, the neck pain got worse. She had an MRI of the cervical spine without contrast performed on 06/05/13, which showed hyperintensity noted in the right lateral cord at the C5-6 level, measuring 5 mm on axial and 18 mm on sagittal images, not present on prior scan from 04/25/09. Mild to moderate stenosis is noted at that level as well. She was referred to Dr. Hal Neer of neurosurgery, who thought evaluation by neurology was more appropriate.I saw her once in consultation on 08/06/13. At that time, I asked her to obtain her previous MRIs on CD and bring to me for review. She underwent blood work. B12 was 690, Lyme antibodies negative. ANA was borderline positive 1:40 with speckled pattern. Sed rate 8, RF negative and ENA 9 panel (ds DNA ab, ribosomal P Protein ab, SSA/SSB antibodies, centromere ab, ENA SM ab Ser-aCnc, SM/RNP, Scl-70 ab and anti-Jo) was negative. Plan was to have her follow up in 4 weeks and proceed with LP but she never brought her MRIs and never followed up.  She subsequently followed up with neurologist Dr. Merlene Laughter in 2019 for continued right sided weakness. MRI of brain with and without contrast performed on 10/28/17 was personally reviewed and was normal. MRI of cervical spine with and without contrast performed on same day  was personally reviewed and demonstrates far righ T2 cord signal abnormalmity at C5 and C6 extending 1.7 cm without enhancement (stable compared to prior imaging from March 2015) as well as subtle posterior T2 signal changes at T1 cord level. With contrast, findings more consistent with chronic demyelination or malacia and not neoplasm. She subsequently underwent 3 day course of  SoluMedrol in September 2019 which was ineffective.Dr. Merlene Laughter recommended blood work and lumbar puncture for CSF analysis but again patient never followed up to have these tests performed.In April-May 2020, she reports worsening right sided weakness and polyarthralgia including the right knee, right hip and both shoulders. She was found to have osteoarthritis in the right knee.  She denies history of head or neck injury. She states that her niece and brother have multiple sclerosis.  10/03/18 MRI Brain w/wo:  Mild nonspecific chronic white matter changes, stable compared to 10/28/17. 10/03/18 MRI Cervical spine w/wo:  nonenhancing abnormal signal in right cord at C5 and C6, stable since 2015. 10/30/18 MRI Thoracic spine w/wo:  Normal appearing cord.  Disc protrusion at T8-9 with mild spinal stenosis. 10/13/18 MRI Lumbar spine w/wo:  Normal 10/24/18 CSF:  Cell count 0, glucose 65, protein 25, gram stain & culture negative, > 5 oligoclonal bands in both CSF and serum, elevated IgG index 0.87, Lyme negative 09/05/18 Serum:  ANA negative, sed rate 23, Lyme negative, NMO IgG negative.  Past Medical History: Past Medical History:  Diagnosis Date  . GERD (gastroesophageal reflux disease)   . Viral endocarditis     Medications: Outpatient Encounter Medications as of 05/17/2019  Medication Sig  . Cholecalciferol (VITAMIN D) 2000 UNITS CAPS Take 1 capsule by mouth every morning.  . Multiple Vitamin (MULTIVITAMIN WITH MINERALS) TABS Take 1 tablet by mouth every morning.  . nitrofurantoin, macrocrystal-monohydrate, (MACROBID) 100 MG capsule TAKE ONE CAPSULE BY MOUTH TWICE A DAY  . raNITIdine HCl (ACID REDUCER PO) Take by mouth as needed.   No facility-administered encounter medications on file as of 05/17/2019.    Allergies: No Known Allergies  Family History: Family History  Problem Relation Age of Onset  . Diabetes Other   . Cancer Other   . Hypertension Other   . Hypertension Mother   .  Diabetes Mother     Social History: Social History   Socioeconomic History  . Marital status: Divorced    Spouse name: Not on file  . Number of children: Not on file  . Years of education: Not on file  . Highest education level: Not on file  Occupational History  . Not on file  Tobacco Use  . Smoking status: Former Smoker    Packs/day: 0.03    Years: 6.00    Pack years: 0.18    Types: Cigarettes    Quit date: 03/22/1997    Years since quitting: 22.1  . Smokeless tobacco: Never Used  Substance and Sexual Activity  . Alcohol use: No  . Drug use: No  . Sexual activity: Not Currently  Other Topics Concern  . Not on file  Social History Narrative   Lives alone one level; R handed; college grad; part time Chiropractor; 2 glasses tea a day; some exercise - is active   Social Determinants of Radio broadcast assistant Strain:   . Difficulty of Paying Living Expenses: Not on file  Food Insecurity:   . Worried About Charity fundraiser in the Last Year: Not on file  . Ran Out of Food in the  Last Year: Not on file  Transportation Needs:   . Lack of Transportation (Medical): Not on file  . Lack of Transportation (Non-Medical): Not on file  Physical Activity:   . Days of Exercise per Week: Not on file  . Minutes of Exercise per Session: Not on file  Stress:   . Feeling of Stress : Not on file  Social Connections:   . Frequency of Communication with Friends and Family: Not on file  . Frequency of Social Gatherings with Friends and Family: Not on file  . Attends Religious Services: Not on file  . Active Member of Clubs or Organizations: Not on file  . Attends Archivist Meetings: Not on file  . Marital Status: Not on file  Intimate Partner Violence:   . Fear of Current or Ex-Partner: Not on file  . Emotionally Abused: Not on file  . Physically Abused: Not on file  . Sexually Abused: Not on file    Observations/Objective:   Height 5\' 2"  (1.575 m), weight 120  lb (54.4 kg). No acute distress.  Alert and oriented.  Speech fluent and not dysarthric.  Language intact.  Eyes orthophoric on primary gaze.  Face symmetric.  Assessment and Plan:   1.  Cervical spinal cord lesion, likely chronic demyelination.  She does not meet the criteria for MS.  Mild white matter findings in brain are more consistent with small vessel disease.  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  Her current right sided symptoms are likely residual from the chronic demyelination.  As she is endorsing new symptoms (paresthesias, discomfort), we will repeat MRI of cervical and thoracic spine with and without contrast to evaluate for any new demyelination.  If there is evidence of new demyelination, then would initiate DMT.  Otherwise, DMT is still an option given the elevated IgG index on CSF, but since she has overall been stable for past several years, she would prefer deferring treatment and continue to monitor, which is also reasonable.  Further recommendations pending MRI results Otherwise, follow up in 6 months.  Follow Up Instructions:    -I discussed the assessment and treatment plan with the patient. The patient was provided an opportunity to ask questions and all were answered. The patient agreed with the plan and demonstrated an understanding of the instructions.   The patient was advised to call back or seek an in-person evaluation if the symptoms worsen or if the condition fails to improve as anticipated.   Dudley Major, DO

## 2019-05-17 ENCOUNTER — Telehealth (INDEPENDENT_AMBULATORY_CARE_PROVIDER_SITE_OTHER): Payer: Medicare PPO | Admitting: Neurology

## 2019-05-17 ENCOUNTER — Encounter: Payer: Self-pay | Admitting: Neurology

## 2019-05-17 ENCOUNTER — Other Ambulatory Visit: Payer: Self-pay

## 2019-05-17 VITALS — Ht 62.0 in | Wt 120.0 lb

## 2019-05-17 DIAGNOSIS — R200 Anesthesia of skin: Secondary | ICD-10-CM | POA: Diagnosis not present

## 2019-05-17 DIAGNOSIS — R202 Paresthesia of skin: Secondary | ICD-10-CM

## 2019-05-17 DIAGNOSIS — G379 Demyelinating disease of central nervous system, unspecified: Secondary | ICD-10-CM

## 2019-05-29 ENCOUNTER — Other Ambulatory Visit (HOSPITAL_COMMUNITY)
Admission: RE | Admit: 2019-05-29 | Discharge: 2019-05-29 | Disposition: A | Discharge status: Home / Self Care | Payer: Medicare PPO | Source: Ambulatory Visit | Attending: Obstetrics | Admitting: Obstetrics

## 2019-05-29 ENCOUNTER — Encounter: Payer: Self-pay | Admitting: Obstetrics

## 2019-05-29 ENCOUNTER — Ambulatory Visit (INDEPENDENT_AMBULATORY_CARE_PROVIDER_SITE_OTHER): Payer: Medicare PPO | Admitting: Obstetrics

## 2019-05-29 ENCOUNTER — Other Ambulatory Visit: Payer: Self-pay

## 2019-05-29 VITALS — BP 132/76 | HR 65 | Ht 62.0 in | Wt 132.0 lb

## 2019-05-29 DIAGNOSIS — N898 Other specified noninflammatory disorders of vagina: Secondary | ICD-10-CM

## 2019-05-29 DIAGNOSIS — Z1151 Encounter for screening for human papillomavirus (HPV): Secondary | ICD-10-CM | POA: Insufficient documentation

## 2019-05-29 DIAGNOSIS — Z780 Asymptomatic menopausal state: Secondary | ICD-10-CM | POA: Insufficient documentation

## 2019-05-29 DIAGNOSIS — N393 Stress incontinence (female) (male): Secondary | ICD-10-CM | POA: Diagnosis not present

## 2019-05-29 DIAGNOSIS — Z01419 Encounter for gynecological examination (general) (routine) without abnormal findings: Secondary | ICD-10-CM | POA: Diagnosis not present

## 2019-05-29 NOTE — Progress Notes (Signed)
Subjective:        Nicole Guerrero is a 69 y.o. female here for a routine exam.  Current complaints: Leaking urine with cough, sneeze and when bladder gets too full.    Personal health questionnaire:  Is patient [name], have a family history of breast and/or ovarian cancer: no Is there a family history of uterine cancer diagnosed at age < 44, gastrointestinal cancer, urinary tract cancer, family member who is a Field seismologist syndrome-associated carrier: no Is the patient overweight and hypertensive, family history of diabetes, personal history of gestational diabetes, preeclampsia or PCOS: no Is patient over 20, have PCOS,  family history of premature CHD under age 73, diabetes, smoke, have hypertension or peripheral artery disease:  no At any time, has a partner hit, kicked or otherwise hurt or frightened you?: no Over the past 2 weeks, have you felt down, depressed or hopeless?: no Over the past 2 weeks, have you felt little interest or pleasure in doing things?:no   Gynecologic History No LMP recorded. Patient is postmenopausal. Contraception: post menopausal status Last Pap: 11-18-2015. Results were: normal Last mammogram: 03-08-2019. Results were: normal  Obstetric History OB History  Gravida Para Term Preterm AB Living  3 3 3     3   SAB TAB Ectopic Multiple Live Births               # Outcome Date GA Lbr Len/2nd Weight Sex Delivery Anes PTL Lv  3 Term           2 Term           1 Term             Past Medical History:  Diagnosis Date  . GERD (gastroesophageal reflux disease)   . Viral endocarditis     Past Surgical History:  Procedure Laterality Date  . TUBAL LIGATION       Current Outpatient Medications:  .  Cholecalciferol (VITAMIN D) 2000 UNITS CAPS, Take 1 capsule by mouth every morning., Disp: , Rfl:  .  Multiple Vitamin (MULTIVITAMIN WITH MINERALS) TABS, Take 1 tablet by mouth every other day. , Disp: , Rfl:  .  raNITIdine HCl (ACID REDUCER PO), Take  by mouth as needed., Disp: , Rfl:  No Known Allergies  Social History   Tobacco Use  . Smoking status: Former Smoker    Packs/day: 0.03    Years: 6.00    Pack years: 0.18    Types: Cigarettes    Quit date: 03/22/1997    Years since quitting: 22.2  . Smokeless tobacco: Never Used  Substance Use Topics  . Alcohol use: No    Family History  Problem Relation Age of Onset  . Diabetes Other   . Cancer Other   . Hypertension Other   . Hypertension Mother   . Diabetes Mother       Review of Systems  Constitutional: negative for fatigue and weight loss Respiratory: negative for cough and wheezing Cardiovascular: negative for chest pain, fatigue and palpitations Gastrointestinal: negative for abdominal pain and change in bowel habits Musculoskeletal:negative for myalgias Neurological: negative for gait problems and tremors Behavioral/Psych: negative for abusive relationship, depression Endocrine: negative for temperature intolerance    Genitourinary:negative for abnormal menstrual periods, genital lesions, hot flashes, sexual problems and vaginal discharge Integument/breast: negative for breast lump, breast tenderness, nipple discharge and skin lesion(s)    Objective:       BP 132/76   Pulse 65   Ht 5'  2" (1.575 m)   Wt 132 lb (59.9 kg)   BMI 24.14 kg/m  General:   alert  Skin:   no rash or abnormalities  Lungs:   clear to auscultation bilaterally  Heart:   regular rate and rhythm, S1, S2 normal, no murmur, click, rub or gallop  Breasts:   normal without suspicious masses, skin or nipple changes or axillary nodes  Abdomen:  normal findings: no organomegaly, soft, non-tender and no hernia  Pelvis:  External genitalia: normal general appearance Urinary system: urethral meatus normal and bladder without fullness, nontender Vaginal: normal without tenderness, induration or masses Cervix: normal appearance Adnexa: normal bimanual exam Uterus: anteverted and non-tender,  normal size   Lab Review Urine pregnancy test Labs reviewed yes Radiologic studies reviewed yes  50% of 25 min visit spent on counseling and coordination of care.   Assessment:   1. Encounter for routine gynecological examination with Papanicolaou smear of cervix Rx: - Cytology - PAP( [place])  2. Vaginal discharge Rx: - Cervicovaginal ancillary only( [place])  3. Postmenopause - doing well  4. SUI (stress urinary incontinence, female) Rx: - Ambulatory referral to Urogynecology    Plan:    Education reviewed: calcium supplements, depression evaluation, low fat, low cholesterol diet, safe sex/STD prevention, self breast exams and weight bearing exercise. Follow up in: 1 year.    Orders Placed This Encounter  Procedures  . Ambulatory referral to Urogynecology    Referral Priority:   Routine    Referral Type:   Consultation    Referral Reason:   Specialty Services Required    Requested Specialty:   Urology    Number of Visits Requested:   1    Shelly Bombard, MD 05/29/2019 10:30 AM

## 2019-05-30 LAB — CYTOLOGY - PAP
Comment: NEGATIVE
Diagnosis: NEGATIVE
High risk HPV: NEGATIVE

## 2019-05-30 LAB — CERVICOVAGINAL ANCILLARY ONLY
Bacterial Vaginitis (gardnerella): NEGATIVE
Candida Glabrata: NEGATIVE
Candida Vaginitis: NEGATIVE
Comment: NEGATIVE
Comment: NEGATIVE
Comment: NEGATIVE

## 2019-06-05 ENCOUNTER — Ambulatory Visit: Payer: Medicare PPO | Attending: Family

## 2019-06-05 DIAGNOSIS — Z23 Encounter for immunization: Secondary | ICD-10-CM

## 2019-06-05 NOTE — Progress Notes (Signed)
   Covid-19 Vaccination Clinic  Name:  Nicole Guerrero    MRN: [id number] DOB: 03/29/1950  06/05/2019  Nicole Guerrero was observed post Covid-19 immunization for 15 minutes without incident. She was provided with Vaccine Information Sheet and instruction to access the V-Safe system.   Nicole Guerrero was instructed to call 911 with any severe reactions post vaccine: Marland Kitchen Difficulty breathing  . Swelling of face and throat  . A fast heartbeat  . A bad rash all over body  . Dizziness and weakness   Immunizations Administered    Name Date Dose VIS Date Route   Moderna COVID-19 Vaccine 06/05/2019 11:16 AM 0.5 mL 02/20/2019 Intramuscular   Manufacturer: Moderna   Lot: [id number]   [id number]

## 2019-06-13 DIAGNOSIS — H9319 Tinnitus, unspecified ear: Secondary | ICD-10-CM | POA: Diagnosis not present

## 2019-06-25 ENCOUNTER — Other Ambulatory Visit: Payer: Self-pay

## 2019-06-25 ENCOUNTER — Ambulatory Visit
Admission: RE | Admit: 2019-06-25 | Discharge: 2019-06-25 | Disposition: A | Discharge status: Home / Self Care | Payer: Medicare PPO | Source: Ambulatory Visit | Attending: Neurology | Admitting: Neurology

## 2019-06-25 DIAGNOSIS — M50223 Other cervical disc displacement at C6-C7 level: Secondary | ICD-10-CM | POA: Diagnosis not present

## 2019-06-25 DIAGNOSIS — R202 Paresthesia of skin: Secondary | ICD-10-CM

## 2019-06-25 DIAGNOSIS — M5124 Other intervertebral disc displacement, thoracic region: Secondary | ICD-10-CM | POA: Diagnosis not present

## 2019-06-25 DIAGNOSIS — R200 Anesthesia of skin: Secondary | ICD-10-CM

## 2019-06-25 DIAGNOSIS — G379 Demyelinating disease of central nervous system, unspecified: Secondary | ICD-10-CM

## 2019-06-25 DIAGNOSIS — M5021 Other cervical disc displacement,  high cervical region: Secondary | ICD-10-CM | POA: Diagnosis not present

## 2019-06-25 DIAGNOSIS — M4802 Spinal stenosis, cervical region: Secondary | ICD-10-CM | POA: Diagnosis not present

## 2019-06-25 DIAGNOSIS — M47812 Spondylosis without myelopathy or radiculopathy, cervical region: Secondary | ICD-10-CM | POA: Diagnosis not present

## 2019-06-25 DIAGNOSIS — M50221 Other cervical disc displacement at C4-C5 level: Secondary | ICD-10-CM | POA: Diagnosis not present

## 2019-06-25 DIAGNOSIS — M50222 Other cervical disc displacement at C5-C6 level: Secondary | ICD-10-CM | POA: Diagnosis not present

## 2019-06-25 MED ORDER — GADOBENATE DIMEGLUMINE 529 MG/ML IV SOLN
12.0000 mL | Freq: Once | INTRAVENOUS | Status: AC | PRN
  Administered 2019-06-25: 12 mL via INTRAVENOUS

## 2019-07-11 DIAGNOSIS — Z8669 Personal history of other diseases of the nervous system and sense organs: Secondary | ICD-10-CM | POA: Diagnosis not present

## 2019-07-11 DIAGNOSIS — L299 Pruritus, unspecified: Secondary | ICD-10-CM | POA: Diagnosis not present

## 2019-07-11 DIAGNOSIS — H9042 Sensorineural hearing loss, unilateral, left ear, with unrestricted hearing on the contralateral side: Secondary | ICD-10-CM | POA: Diagnosis not present

## 2019-07-11 DIAGNOSIS — Z0110 Encounter for examination of ears and hearing without abnormal findings: Secondary | ICD-10-CM | POA: Diagnosis not present

## 2019-07-27 DIAGNOSIS — Z0001 Encounter for general adult medical examination with abnormal findings: Secondary | ICD-10-CM | POA: Diagnosis not present

## 2019-07-27 DIAGNOSIS — M5030 Other cervical disc degeneration, unspecified cervical region: Secondary | ICD-10-CM | POA: Diagnosis not present

## 2019-07-27 DIAGNOSIS — Z1389 Encounter for screening for other disorder: Secondary | ICD-10-CM | POA: Diagnosis not present

## 2019-07-27 DIAGNOSIS — I301 Infective pericarditis: Secondary | ICD-10-CM | POA: Diagnosis not present

## 2019-07-27 DIAGNOSIS — E559 Vitamin D deficiency, unspecified: Secondary | ICD-10-CM | POA: Diagnosis not present

## 2019-07-27 DIAGNOSIS — Z681 Body mass index (BMI) 19 or less, adult: Secondary | ICD-10-CM | POA: Diagnosis not present

## 2019-07-27 DIAGNOSIS — R7309 Other abnormal glucose: Secondary | ICD-10-CM | POA: Diagnosis not present

## 2019-07-27 DIAGNOSIS — M810 Age-related osteoporosis without current pathological fracture: Secondary | ICD-10-CM | POA: Diagnosis not present

## 2019-07-27 DIAGNOSIS — M1991 Primary osteoarthritis, unspecified site: Secondary | ICD-10-CM | POA: Diagnosis not present

## 2019-08-01 ENCOUNTER — Encounter: Payer: Self-pay | Admitting: Obstetrics

## 2019-08-01 DIAGNOSIS — N393 Stress incontinence (female) (male): Secondary | ICD-10-CM | POA: Diagnosis not present

## 2019-08-01 DIAGNOSIS — N8111 Cystocele, midline: Secondary | ICD-10-CM | POA: Diagnosis not present

## 2019-08-01 DIAGNOSIS — R82998 Other abnormal findings in urine: Secondary | ICD-10-CM | POA: Diagnosis not present

## 2019-08-01 DIAGNOSIS — N812 Incomplete uterovaginal prolapse: Secondary | ICD-10-CM | POA: Diagnosis not present

## 2019-08-30 DIAGNOSIS — N8111 Cystocele, midline: Secondary | ICD-10-CM | POA: Diagnosis not present

## 2019-08-30 DIAGNOSIS — Z01812 Encounter for preprocedural laboratory examination: Secondary | ICD-10-CM | POA: Diagnosis not present

## 2019-08-30 DIAGNOSIS — N393 Stress incontinence (female) (male): Secondary | ICD-10-CM | POA: Diagnosis not present

## 2019-09-10 DIAGNOSIS — N393 Stress incontinence (female) (male): Secondary | ICD-10-CM | POA: Diagnosis not present

## 2019-09-10 DIAGNOSIS — N813 Complete uterovaginal prolapse: Secondary | ICD-10-CM | POA: Diagnosis not present

## 2019-09-10 DIAGNOSIS — N812 Incomplete uterovaginal prolapse: Secondary | ICD-10-CM | POA: Diagnosis not present

## 2019-09-10 DIAGNOSIS — N814 Uterovaginal prolapse, unspecified: Secondary | ICD-10-CM | POA: Diagnosis not present

## 2019-09-19 DIAGNOSIS — R399 Unspecified symptoms and signs involving the genitourinary system: Secondary | ICD-10-CM | POA: Diagnosis not present

## 2019-10-17 DIAGNOSIS — R8290 Unspecified abnormal findings in urine: Secondary | ICD-10-CM | POA: Diagnosis not present

## 2019-10-17 DIAGNOSIS — N393 Stress incontinence (female) (male): Secondary | ICD-10-CM | POA: Diagnosis not present

## 2019-10-17 DIAGNOSIS — N812 Incomplete uterovaginal prolapse: Secondary | ICD-10-CM | POA: Diagnosis not present

## 2019-10-17 DIAGNOSIS — R82998 Other abnormal findings in urine: Secondary | ICD-10-CM | POA: Diagnosis not present

## 2019-10-22 DIAGNOSIS — J383 Other diseases of vocal cords: Secondary | ICD-10-CM | POA: Diagnosis not present

## 2019-10-22 DIAGNOSIS — K219 Gastro-esophageal reflux disease without esophagitis: Secondary | ICD-10-CM | POA: Diagnosis not present

## 2019-11-14 NOTE — Progress Notes (Signed)
NEUROLOGY FOLLOW UP OFFICE NOTE  Nicole Guerrero [id number]  HISTORY OF PRESENT ILLNESS: Nicole Guerrero is a 69 year old right-handed African American woman with GERD and remote history of viral endocarditis whofollows up for clinically isolated syndrome.  UPDATE: MRI of cervical and thoracic spine with and without contrast on 06/25/2019 personally reviewed showed unchanged T2 hyperintense lesion in the right lateral column at C5-C6 level as well as subtle T2 focus within posterior column at T1 level, which may be lesion vs artifact.  No abnormal enhancement.  Multilevel degenerative spine changes noted with mild spinal canal stenosis at C3-C6 and T8-T9.  She feels her gait has improved.  She started having right hip pain.  She also has arthritis in her right shoulder and her arm feels heavy.  She has tingling over her right elbow.        HISTORY: She is suffered from right-sided neck painsince at least 2013. The pain radiates on the right, from the shoulder up to the right side of her occipital region. It is exacerbated with movement of her head to the right. There is no shooting pain down the arm nor numbness involving the arms. She denies any numbness in the legs. She did report cramping on the right leg and right arm, which has since resolved after starting vitamin D supplements 4 vitamin D deficiency. Occasionally, she notes soreness in the right hip and her right leg as a sensation that it's a little weaker. She was treated by a local neurologist in [place] around that time. She reports an MRI of the brain at that time, but results are not available. She had undergone physical therapy which helped. More recently, the neck pain got worse. She had an MRI of the cervical spine without contrast performed on 06/05/13, which showed hyperintensity noted in the right lateral cord at the C5-6 level, measuring 5 mm on axial and 18 mm on sagittal images, not present on prior scan from 04/25/09. Mild to  moderate stenosis is noted at that level as well. She was referred to Dr. Hal Neer of neurosurgery, who thought evaluation by neurology was more appropriate.I saw her once in consultation on 08/06/13. At that time, I asked her to obtain her previous MRIs on CD and bring to me for review. She underwent blood work. B12 was 690, Lyme antibodies negative. ANA was borderline positive 1:40 with speckled pattern. Sed rate 8, RF negative and ENA 9 panel (ds DNA ab, ribosomal P Protein ab, SSA/SSB antibodies, centromere ab, ENA SM ab Ser-aCnc, SM/RNP, Scl-70 ab and anti-Jo) was negative. Plan was to have her follow up in 4 weeks and proceed with LP but she never brought her MRIs and never followed up.  She subsequently followed up with neurologist Dr. Merlene Laughter in 2019 for continued right sided weakness. MRI of brain with and without contrast performed on 10/28/17 was personally reviewed and was normal. MRI of cervical spine with and without contrast performed on same day was personally reviewed and demonstrates far righ T2 cord signal abnormalmity at C5 and C6 extending 1.7 cm without enhancement (stable compared to prior imaging from March 2015) as well as subtle posterior T2 signal changes at T1 cord level. With contrast, findings more consistent with chronic demyelination or malacia and not neoplasm. She subsequently underwent 3 day course of SoluMedrol in September 2019 which was ineffective.Dr. Merlene Laughter recommended blood work and lumbar puncture for CSF analysis but again patient never followed up to have these tests performed.In April-May  2020,she reports worsening right sided weakness and polyarthralgia including the right knee, right hip and both shoulders. She was found to have osteoarthritis in the right knee.  She denies history of head or neck injury. She states that her nieceand brotherhavemultiple sclerosis.  10/03/18 MRI Brain w/wo: Mild nonspecific chronic white matter changes, stable  compared to 10/28/17. 10/03/18 MRI Cervical spine w/wo: nonenhancing abnormal signal in right cord at C5 and C6, stable since 2015. 10/30/18 MRI Thoracic spine w/wo: Normal appearing cord. Disc protrusion at T8-9 with mild spinal stenosis. 10/13/18 MRI Lumbar spine w/wo: Normal 10/24/18 CSF: Cell count 0, glucose 65, protein 25, gram stain &culture negative, >5 oligoclonal bands in both CSF and serum, elevated IgG index 0.87, Lyme negative 09/05/18 Serum: ANA negative, sed rate 23, Lyme negative, NMO IgG negative.  PAST MEDICAL HISTORY: Past Medical History:  Diagnosis Date  . GERD (gastroesophageal reflux disease)   . Viral endocarditis     MEDICATIONS: Current Outpatient Medications on File Prior to Visit  Medication Sig Dispense Refill  . Cholecalciferol (VITAMIN D) 2000 UNITS CAPS Take 1 capsule by mouth every morning.    . Multiple Vitamin (MULTIVITAMIN WITH MINERALS) TABS Take 1 tablet by mouth every other day.     . raNITIdine HCl (ACID REDUCER PO) Take by mouth as needed.     No current facility-administered medications on file prior to visit.    ALLERGIES: No Known Allergies  FAMILY HISTORY: Family History  Problem Relation Age of Onset  . Diabetes Other   . Cancer Other   . Hypertension Other   . Hypertension Mother   . Diabetes Mother     SOCIAL HISTORY: Social History   Socioeconomic History  . Marital status: Divorced    Spouse name: Not on file  . Number of children: Not on file  . Years of education: Not on file  . Highest education level: Not on file  Occupational History  . Not on file  Tobacco Use  . Smoking status: Former Smoker    Packs/day: 0.03    Years: 6.00    Pack years: 0.18    Types: Cigarettes    Quit date: 03/22/1997    Years since quitting: 22.6  . Smokeless tobacco: Never Used  Vaping Use  . Vaping Use: Never used  Substance and Sexual Activity  . Alcohol use: No  . Drug use: No  . Sexual activity: Not Currently  Other Topics  Concern  . Not on file  Social History Narrative   Lives alone one level; R handed; college grad; part time Chiropractor; 2 glasses tea a day; some exercise - is active   Social Determinants of Radio broadcast assistant Strain:   . Difficulty of Paying Living Expenses: Not on file  Food Insecurity:   . Worried About Charity fundraiser in the Last Year: Not on file  . Ran Out of Food in the Last Year: Not on file  Transportation Needs:   . Lack of Transportation (Medical): Not on file  . Lack of Transportation (Non-Medical): Not on file  Physical Activity:   . Days of Exercise per Week: Not on file  . Minutes of Exercise per Session: Not on file  Stress:   . Feeling of Stress : Not on file  Social Connections:   . Frequency of Communication with Friends and Family: Not on file  . Frequency of Social Gatherings with Friends and Family: Not on file  . Attends Religious Services:  Not on file  . Active Member of Clubs or Organizations: Not on file  . Attends Archivist Meetings: Not on file  . Marital Status: Not on file  Intimate Partner Violence:   . Fear of Current or Ex-Partner: Not on file  . Emotionally Abused: Not on file  . Physically Abused: Not on file  . Sexually Abused: Not on file     PHYSICAL EXAM: Blood pressure (!) 157/99, pulse (!) 57, height 5\' 2"  (1.575 m), weight 134 lb 6.4 oz (61 kg), SpO2 97 %. General: No acute distress.  Patient appears well-groomed.   Head:  Normocephalic/atraumatic Eyes:  Fundi examined but not visualized Neck: supple, no paraspinal tenderness, full range of motion Heart:  Regular rate and rhythm Lungs:  Clear to auscultation bilaterally Back: No paraspinal tenderness Neurological Exam: alert and oriented to person, place, and time. Attention span and concentration intact, recent and remote memory intact, fund of knowledge intact.  Speech fluent and not dysarthric, language intact.  CN II-XII intact. Bulk and tone  normal, 4+/5 right proximal leg and 5-/5 right distal leg;  Otherwise, muscle strength 5/5 throughout.  Sensation to light touch, pinprick and vibration intact.  Deep tendon reflexes 2+ throughout, toes downgoing.  Finger to nose and heel to shin testing intact.  Right limp.  Romberg negative.  IMPRESSION: 1.  Clinically isolated syndrome.  DMT is indicated for CIS, however she chooses to monitor for now. 2.   Cervical spondylosis  PLAN: 1.  Taking D3 2000 IU daily.  Check D level today. 2.  Repeat MRI of brain and cervical spine with and without contrast in 9 months. 3.  Follow up in 9 months (after repeat MRIs)  Metta Clines, DO  CC: Sharilyn Sites, MD

## 2019-11-15 ENCOUNTER — Ambulatory Visit: Payer: Medicare PPO | Admitting: Neurology

## 2019-11-15 ENCOUNTER — Encounter: Payer: Self-pay | Admitting: Neurology

## 2019-11-15 ENCOUNTER — Other Ambulatory Visit: Payer: Medicare PPO

## 2019-11-15 ENCOUNTER — Other Ambulatory Visit: Payer: Self-pay

## 2019-11-15 VITALS — BP 157/99 | HR 57 | Ht 62.0 in | Wt 134.4 lb

## 2019-11-15 DIAGNOSIS — G35 Multiple sclerosis: Secondary | ICD-10-CM

## 2019-11-15 DIAGNOSIS — G379 Demyelinating disease of central nervous system, unspecified: Secondary | ICD-10-CM

## 2019-11-15 DIAGNOSIS — M47812 Spondylosis without myelopathy or radiculopathy, cervical region: Secondary | ICD-10-CM

## 2019-11-15 NOTE — Patient Instructions (Signed)
1.  Check vitamin D level today 2.  Repeat MRI of brain and cervical spine in 9 months 3.  Follow up in 9 months, after repeat MRI

## 2020-01-08 DIAGNOSIS — H5213 Myopia, bilateral: Secondary | ICD-10-CM | POA: Diagnosis not present

## 2020-01-08 DIAGNOSIS — H40013 Open angle with borderline findings, low risk, bilateral: Secondary | ICD-10-CM | POA: Diagnosis not present

## 2020-01-08 DIAGNOSIS — H2513 Age-related nuclear cataract, bilateral: Secondary | ICD-10-CM | POA: Diagnosis not present

## 2020-01-23 ENCOUNTER — Ambulatory Visit: Payer: Medicare PPO | Attending: Family

## 2020-01-23 DIAGNOSIS — Z23 Encounter for immunization: Secondary | ICD-10-CM

## 2020-03-12 ENCOUNTER — Other Ambulatory Visit (HOSPITAL_COMMUNITY): Payer: Self-pay | Admitting: Obstetrics

## 2020-03-12 DIAGNOSIS — Z1231 Encounter for screening mammogram for malignant neoplasm of breast: Secondary | ICD-10-CM

## 2020-03-19 DIAGNOSIS — N9489 Other specified conditions associated with female genital organs and menstrual cycle: Secondary | ICD-10-CM | POA: Diagnosis not present

## 2020-04-03 ENCOUNTER — Other Ambulatory Visit: Payer: Self-pay

## 2020-04-03 ENCOUNTER — Ambulatory Visit (HOSPITAL_COMMUNITY)
Admission: RE | Admit: 2020-04-03 | Discharge: 2020-04-03 | Disposition: A | Discharge status: Home / Self Care | Payer: Medicare PPO | Source: Ambulatory Visit | Attending: Obstetrics | Admitting: Obstetrics

## 2020-04-03 ENCOUNTER — Other Ambulatory Visit (HOSPITAL_COMMUNITY): Payer: Self-pay | Admitting: Registered Nurse

## 2020-04-03 DIAGNOSIS — Z1231 Encounter for screening mammogram for malignant neoplasm of breast: Secondary | ICD-10-CM | POA: Diagnosis not present

## 2020-04-05 NOTE — Progress Notes (Signed)
   Covid-19 Vaccination Clinic  Name:  Nicole Guerrero    MRN: [id number] DOB: 05-07-1950  04/05/2020  Nicole Guerrero was observed post Covid-19 immunization for 15 minutes without incident. She was provided with Vaccine Information Sheet and instruction to access the V-Safe system.   Nicole Guerrero was instructed to call 911 with any severe reactions post vaccine: Marland Kitchen Difficulty breathing  . Swelling of face and throat  . A fast heartbeat  . A bad rash all over body  . Dizziness and weakness   Immunizations Administered    Name Date Dose VIS Date Route   Moderna Covid-19 Booster Vaccine 01/23/2020  6:45 PM 0.25 mL 01/09/2020 Intramuscular   Manufacturer: Moderna   Lot: [id number]   [place]: [id number]

## 2020-07-26 ENCOUNTER — Emergency Department (HOSPITAL_COMMUNITY)
Admission: EM | Admit: 2020-07-26 | Discharge: 2020-07-26 | Disposition: A | Discharge status: Home / Self Care | Payer: Medicare PPO | Attending: Emergency Medicine | Admitting: Emergency Medicine

## 2020-07-26 ENCOUNTER — Other Ambulatory Visit: Payer: Self-pay

## 2020-07-26 ENCOUNTER — Emergency Department (HOSPITAL_COMMUNITY): Payer: Medicare PPO

## 2020-07-26 ENCOUNTER — Encounter (HOSPITAL_COMMUNITY): Payer: Self-pay | Admitting: Emergency Medicine

## 2020-07-26 DIAGNOSIS — R0602 Shortness of breath: Secondary | ICD-10-CM | POA: Diagnosis not present

## 2020-07-26 DIAGNOSIS — R079 Chest pain, unspecified: Secondary | ICD-10-CM | POA: Insufficient documentation

## 2020-07-26 DIAGNOSIS — Z87891 Personal history of nicotine dependence: Secondary | ICD-10-CM | POA: Insufficient documentation

## 2020-07-26 DIAGNOSIS — J9811 Atelectasis: Secondary | ICD-10-CM | POA: Diagnosis not present

## 2020-07-26 DIAGNOSIS — M545 Low back pain, unspecified: Secondary | ICD-10-CM | POA: Insufficient documentation

## 2020-07-26 DIAGNOSIS — I700 Atherosclerosis of aorta: Secondary | ICD-10-CM | POA: Diagnosis not present

## 2020-07-26 DIAGNOSIS — R0789 Other chest pain: Secondary | ICD-10-CM | POA: Diagnosis not present

## 2020-07-26 DIAGNOSIS — M549 Dorsalgia, unspecified: Secondary | ICD-10-CM

## 2020-07-26 LAB — CBC WITH DIFFERENTIAL/PLATELET
Abs Immature Granulocytes: 0.02 10*3/uL (ref 0.00–0.07)
Basophils Absolute: 0.1 10*3/uL (ref 0.0–0.1)
Basophils Relative: 1 %
Eosinophils Absolute: 0.4 10*3/uL (ref 0.0–0.5)
Eosinophils Relative: 6 %
HCT: 39.6 % (ref 36.0–46.0)
Hemoglobin: 12.7 g/dL (ref 12.0–15.0)
Immature Granulocytes: 0 %
Lymphocytes Relative: 38 %
Lymphs Abs: 2.5 10*3/uL (ref 0.7–4.0)
MCH: 30.1 pg (ref 26.0–34.0)
MCHC: 32.1 g/dL (ref 30.0–36.0)
MCV: 93.8 fL (ref 80.0–100.0)
Monocytes Absolute: 0.6 10*3/uL (ref 0.1–1.0)
Monocytes Relative: 8 %
Neutro Abs: 3 10*3/uL (ref 1.7–7.7)
Neutrophils Relative %: 47 %
Platelets: 256 10*3/uL (ref 150–400)
RBC: 4.22 MIL/uL (ref 3.87–5.11)
RDW: 14 % (ref 11.5–15.5)
WBC: 6.6 10*3/uL (ref 4.0–10.5)
nRBC: 0 % (ref 0.0–0.2)

## 2020-07-26 LAB — BASIC METABOLIC PANEL
Anion gap: 6 (ref 5–15)
BUN: 14 mg/dL (ref 8–23)
CO2: 28 mmol/L (ref 22–32)
Calcium: 9.6 mg/dL (ref 8.9–10.3)
Chloride: 105 mmol/L (ref 98–111)
Creatinine, Ser: 0.83 mg/dL (ref 0.44–1.00)
GFR, Estimated: >60 mL/min (ref >60)
Glucose, Bld: 110 mg/dL — ABNORMAL HIGH (ref 70–99)
Potassium: 3.8 mmol/L (ref 3.5–5.1)
Sodium: 139 mmol/L (ref 135–145)

## 2020-07-26 LAB — TROPONIN I (HIGH SENSITIVITY)
Troponin I (High Sensitivity): 3 ng/L (ref <18)
Troponin I (High Sensitivity): 3 ng/L (ref <18)

## 2020-07-26 MED ORDER — PANTOPRAZOLE SODIUM 20 MG PO TBEC: 20.0000 mg | DELAYED_RELEASE_TABLET | Freq: Every day | ORAL | 0 refills | Status: DC

## 2020-07-26 MED ORDER — NAPROXEN 500 MG PO TABS: 500.0000 mg | ORAL_TABLET | Freq: Two times a day (BID) | ORAL | 0 refills | Status: DC

## 2020-07-26 NOTE — ED Provider Notes (Signed)
[facility] EMERGENCY DEPARTMENT Provider Note   CSN: [id number] Arrival date & time: 07/26/20  1706     History Chief Complaint  Patient presents with  . Chest Pain    Nicole Guerrero is a 70 y.o. female who presents with concern for 24 hours of back and chest pain.  Patient states that it started in her mid back and radiated around into her right and left shoulders, however this morning it started to radiate into her right chest and onto her lower back.  She denies any shortness of breath, cough, chest pain, nausea, vomiting, diarrhea, fevers or chills at home.  She endorses remote history of viral endocarditis, but states she otherwise has no cardiac history.  She denies any recent falls, prolonged immobilizations, trauma, or new activities.  Have personally reviewed this patient's medical records.  She has history of osteoarthritis, GERD.   HPI     Past Medical History:  Diagnosis Date  . GERD (gastroesophageal reflux disease)   . Viral endocarditis     Patient Active Problem List   Diagnosis Date Noted  . Cystocele, grade 3 10/22/2013  . OSTEOARTHRITIS, SHOULDER 05/19/2009  . SHOULDER PAIN, RIGHT 05/19/2009  . CERVICAL SPONDYLOSIS WITHOUT MYELOPATHY 05/19/2009  . NECK PAIN 05/19/2009    Past Surgical History:  Procedure Laterality Date  . TUBAL LIGATION       OB History    Gravida  3   Para  3   Term  3   Preterm      AB      Living  3     SAB      IAB      Ectopic      Multiple      Live Births              Family History  Problem Relation Age of Onset  . Diabetes Other   . Cancer Other   . Hypertension Other   . Hypertension Mother   . Diabetes Mother     Social History   Tobacco Use  . Smoking status: Former Smoker    Packs/day: 0.03    Years: 6.00    Pack years: 0.18    Types: Cigarettes    Quit date: 03/22/1997    Years since quitting: 23.3  . Smokeless tobacco: Never Used  Vaping Use  . Vaping Use: Never used   Substance Use Topics  . Alcohol use: No  . Drug use: No    Home Medications Prior to Admission medications   Medication Sig Start Date End Date Taking? Authorizing Provider  Cholecalciferol (VITAMIN D) 2000 UNITS CAPS Take 1 capsule by mouth every morning.    [provider]  estradiol (ESTRACE) 0.1 MG/GM vaginal cream  09/10/19   [provider]  Multiple Vitamin (MULTIVITAMIN WITH MINERALS) TABS Take 1 tablet by mouth every other day.     [provider]  raNITIdine HCl (ACID REDUCER PO) Take by mouth as needed.    [provider]    Allergies    Patient has no known allergies.  Review of Systems   Review of Systems  Constitutional: Negative.   HENT: Negative.   Eyes: Negative.   Respiratory: Positive for chest tightness. Negative for cough and shortness of breath.   Cardiovascular: Positive for chest pain. Negative for palpitations and leg swelling.  Gastrointestinal: Negative.   Genitourinary: Negative.   Musculoskeletal: Positive for back pain and myalgias. Negative for arthralgias, gait problem,  joint swelling, neck pain and neck stiffness.  Skin: Negative.   Neurological: Negative.   Hematological: Negative.   Psychiatric/Behavioral: Negative.     Physical Exam Updated Vital Signs BP (!) 142/74 (BP Location: Right Arm)   Pulse 80   Temp 98.3 F (36.8 C) (Oral)   Resp 18   Ht 5\' 2"  (1.575 m)   Wt 61.2 kg   SpO2 98%   BMI 24.69 kg/m   Physical Exam Vitals and nursing note reviewed.  Constitutional:      Appearance: She is normal weight. She is not ill-appearing or toxic-appearing.  HENT:     Head: Normocephalic and atraumatic.     Nose: Nose normal.     Mouth/Throat:     Mouth: Mucous membranes are moist.     Pharynx: Oropharynx is clear. Uvula midline. No oropharyngeal exudate, posterior oropharyngeal erythema or uvula swelling.     Tonsils: No tonsillar exudate.  Eyes:     General: Lids are normal. Vision grossly  intact.        Right eye: No discharge.        Left eye: No discharge.     Extraocular Movements: Extraocular movements intact.     Conjunctiva/sclera: Conjunctivae normal.     Pupils: Pupils are equal, round, and reactive to light.  Neck:     Trachea: Trachea and phonation normal.  Cardiovascular:     Rate and Rhythm: Normal rate and regular rhythm.     Pulses: Normal pulses.     Heart sounds: Normal heart sounds. No murmur heard.   Pulmonary:     Effort: Pulmonary effort is normal. No tachypnea, bradypnea, accessory muscle usage, prolonged expiration or respiratory distress.     Breath sounds: Examination of the right-lower field reveals decreased breath sounds. Examination of the left-lower field reveals decreased breath sounds. Decreased breath sounds present. No wheezing or rales.  Chest:     Chest wall: No mass, lacerations, deformity, swelling, tenderness, crepitus or edema.  Abdominal:     General: Bowel sounds are normal. There is no distension.     Palpations: Abdomen is soft.     Tenderness: There is no abdominal tenderness. There is no right CVA tenderness, left CVA tenderness, guarding or rebound.  Musculoskeletal:        General: No deformity.     Cervical back: Normal range of motion and neck supple. No rigidity or crepitus. No pain with movement, spinous process tenderness or muscular tenderness.     Right lower leg: No edema.     Left lower leg: No edema.  Lymphadenopathy:     Cervical: No cervical adenopathy.  Skin:    General: Skin is warm and dry.  Neurological:     Mental Status: She is alert and oriented to person, place, and time. Mental status is at baseline.     Sensory: Sensation is intact.     Motor: Motor function is intact.     Gait: Gait is intact.  Psychiatric:        Mood and Affect: Mood normal.     ED Results / Procedures / Treatments   Labs (all labs ordered are listed, but only abnormal results are displayed) Labs Reviewed  BASIC  METABOLIC PANEL  CBC WITH DIFFERENTIAL/PLATELET  TROPONIN I (HIGH SENSITIVITY)    EKG EKG Interpretation  Date/Time:  Saturday Jul 26 2020 17:20:37 EDT Ventricular Rate:  81 PR Interval:  152 QRS Duration: 74 QT Interval:  354 QTC Calculation: 411 R  Axis:   55 Text Interpretation: Normal sinus rhythm Possible Left atrial enlargement Septal infarct , age undetermined Abnormal ECG Confirmed by Noemi Chapel [phone]) on 07/26/2020 5:34:11 PM   Radiology No results found.  Procedures Procedures   Medications Ordered in ED Medications - No data to display  ED Course  I have reviewed the triage vital signs and the nursing notes.  Pertinent labs & imaging results that were available during my care of the patient were reviewed by me and considered in my medical decision making (see chart for details).    MDM Rules/Calculators/A&P                         70 year old female presents with 24 hours of intermittent upper back and chest pain that is worsened with movement.  Differential diagnosis includes but is not limited to ACS, PE, pleural effusion, pericarditis, musculoskeletal injury.  Hypertensive on intake, vital signs otherwise normal.  Cardiopulmonary exam is significant only for diminished breath sounds in the lung bases bilaterally.  Abdominal exam is benign.  There is no focal tenderness in the midline low back, no skin changes or signs of bruising.  There is no lower extremity edema.  EKG with normal sinus rhythm, no STEMI.  Chest x-ray and laboratory studies pending at time of shift change.  Care of this patient was signed out to oncoming ED provider, Dr. Noemi Chapel, at time of shift change.  All pertinent HPI, physical exam, and laboratory findings were reviewed with him prior to my departure.  This chart was dictated using voice recognition software, Dragon. Despite the best efforts of this provider to proofread and correct errors, errors may still occur which can change  documentation meaning.  Final Clinical Impression(s) / ED Diagnoses Final diagnoses:  None    Rx / DC Orders ED Discharge Orders    None       Aura Dials 07/26/20 Paticia Stack, MD 07/26/20 2203

## 2020-07-26 NOTE — ED Provider Notes (Signed)
Medical screening examination/treatment/procedure(s) were conducted as a shared visit with non-physician practitioner(s) and myself.  I personally evaluated the patient during the encounter.  Clinical Impression:   Final diagnoses:  Chest pain, unspecified type  Acute back pain, unspecified back location, unspecified back pain laterality    Patient is a 70 year old female presenting with some chest discomfort, also has some pain in her lower back mid back upper back and into the right arm, sometimes the pain is right sometimes in his left, she is not nauseated vomiting or having any fevers.  She does have a history of endocarditis about 20 years ago.  On exam patient is well-appearing, clear heart and lung sounds, no edema, no distress, normal pulses, soft nontender abdomen, no signs of rashes to the trunk.  EKG is nonischemic  Labs pending, anticipate discharge if negative, this is certainly atypical and not consistent with an acute coronary syndrome by history or exam.   Noemi Chapel, MD 07/26/20 2204

## 2020-07-26 NOTE — ED Triage Notes (Addendum)
Patient c/o chest pain in which she states "I thought it was just gas but it's not going away." Per patient started in mid back and then radiated into right arm and chest. Patient states it is now radiating into lower back. Per patient shortness of breath and some weakness. Denies any nausea, vomiting, diarrhea, or fevers. Patient reports cardiac hx of viral endocarditis in which she was placed on life support.

## 2020-07-26 NOTE — ED Notes (Addendum)
Pt ambulatory from [place] to room 9- steady gait, no SOB, NAD. Pt sitting in chair, "does not want to lay on bed."

## 2020-07-26 NOTE — Discharge Instructions (Signed)
Your testing has been reassuring, return to the emergency department for any other clinical concerns or any severe worsening symptoms, otherwise see your doctor in 1 week and call the cardiologist Monday morning

## 2020-07-30 DIAGNOSIS — E039 Hypothyroidism, unspecified: Secondary | ICD-10-CM | POA: Diagnosis not present

## 2020-07-30 DIAGNOSIS — M1991 Primary osteoarthritis, unspecified site: Secondary | ICD-10-CM | POA: Diagnosis not present

## 2020-07-30 DIAGNOSIS — Z0000 Encounter for general adult medical examination without abnormal findings: Secondary | ICD-10-CM | POA: Diagnosis not present

## 2020-07-30 DIAGNOSIS — Z6825 Body mass index (BMI) 25.0-25.9, adult: Secondary | ICD-10-CM | POA: Diagnosis not present

## 2020-07-30 DIAGNOSIS — I301 Infective pericarditis: Secondary | ICD-10-CM | POA: Diagnosis not present

## 2020-07-30 DIAGNOSIS — R7309 Other abnormal glucose: Secondary | ICD-10-CM | POA: Diagnosis not present

## 2020-07-30 DIAGNOSIS — E663 Overweight: Secondary | ICD-10-CM | POA: Diagnosis not present

## 2020-07-30 DIAGNOSIS — B029 Zoster without complications: Secondary | ICD-10-CM | POA: Diagnosis not present

## 2020-07-30 DIAGNOSIS — M5030 Other cervical disc degeneration, unspecified cervical region: Secondary | ICD-10-CM | POA: Diagnosis not present

## 2020-07-30 DIAGNOSIS — Z1389 Encounter for screening for other disorder: Secondary | ICD-10-CM | POA: Diagnosis not present

## 2020-07-30 DIAGNOSIS — E559 Vitamin D deficiency, unspecified: Secondary | ICD-10-CM | POA: Diagnosis not present

## 2020-07-30 DIAGNOSIS — K219 Gastro-esophageal reflux disease without esophagitis: Secondary | ICD-10-CM | POA: Diagnosis not present

## 2020-08-02 ENCOUNTER — Other Ambulatory Visit: Payer: Medicare PPO

## 2020-08-07 ENCOUNTER — Other Ambulatory Visit: Payer: Self-pay | Admitting: Neurology

## 2020-08-07 DIAGNOSIS — M47812 Spondylosis without myelopathy or radiculopathy, cervical region: Secondary | ICD-10-CM

## 2020-08-07 DIAGNOSIS — G35 Multiple sclerosis: Secondary | ICD-10-CM

## 2020-08-07 DIAGNOSIS — G379 Demyelinating disease of central nervous system, unspecified: Secondary | ICD-10-CM

## 2020-08-12 ENCOUNTER — Other Ambulatory Visit: Payer: Medicare PPO

## 2020-08-14 ENCOUNTER — Ambulatory Visit: Payer: Medicare PPO | Admitting: Neurology

## 2020-08-20 ENCOUNTER — Other Ambulatory Visit: Payer: Medicare PPO

## 2020-08-20 DIAGNOSIS — N9489 Other specified conditions associated with female genital organs and menstrual cycle: Secondary | ICD-10-CM | POA: Diagnosis not present

## 2020-08-20 DIAGNOSIS — N898 Other specified noninflammatory disorders of vagina: Secondary | ICD-10-CM | POA: Diagnosis not present

## 2020-08-20 DIAGNOSIS — Z09 Encounter for follow-up examination after completed treatment for conditions other than malignant neoplasm: Secondary | ICD-10-CM | POA: Diagnosis not present

## 2020-08-20 DIAGNOSIS — N813 Complete uterovaginal prolapse: Secondary | ICD-10-CM | POA: Diagnosis not present

## 2020-08-20 DIAGNOSIS — N814 Uterovaginal prolapse, unspecified: Secondary | ICD-10-CM | POA: Diagnosis not present

## 2020-08-20 DIAGNOSIS — M5459 Other low back pain: Secondary | ICD-10-CM | POA: Diagnosis not present

## 2020-08-20 DIAGNOSIS — M545 Low back pain, unspecified: Secondary | ICD-10-CM | POA: Diagnosis not present

## 2020-08-28 ENCOUNTER — Encounter: Payer: Self-pay | Admitting: *Deleted

## 2020-08-29 ENCOUNTER — Ambulatory Visit
Admission: RE | Admit: 2020-08-29 | Discharge: 2020-08-29 | Disposition: A | Discharge status: Home / Self Care | Payer: Medicare PPO | Source: Ambulatory Visit | Attending: Neurology | Admitting: Neurology

## 2020-08-29 DIAGNOSIS — M47812 Spondylosis without myelopathy or radiculopathy, cervical region: Secondary | ICD-10-CM

## 2020-08-29 DIAGNOSIS — G35 Multiple sclerosis: Secondary | ICD-10-CM | POA: Diagnosis not present

## 2020-08-29 DIAGNOSIS — G379 Demyelinating disease of central nervous system, unspecified: Secondary | ICD-10-CM

## 2020-08-29 DIAGNOSIS — R4182 Altered mental status, unspecified: Secondary | ICD-10-CM | POA: Diagnosis not present

## 2020-08-29 MED ORDER — GADOBENATE DIMEGLUMINE 529 MG/ML IV SOLN
12.0000 mL | Freq: Once | INTRAVENOUS | Status: AC | PRN
  Administered 2020-08-29: 12 mL via INTRAVENOUS

## 2020-09-01 NOTE — Progress Notes (Signed)
Pt advised of her MRI.

## 2020-09-01 NOTE — Progress Notes (Signed)
Left message to call office at 09/01/2020 at 320

## 2020-09-18 ENCOUNTER — Other Ambulatory Visit (HOSPITAL_COMMUNITY): Payer: Self-pay | Admitting: Internal Medicine

## 2020-09-18 DIAGNOSIS — M25511 Pain in right shoulder: Secondary | ICD-10-CM

## 2020-09-18 DIAGNOSIS — M810 Age-related osteoporosis without current pathological fracture: Secondary | ICD-10-CM | POA: Diagnosis not present

## 2020-09-18 DIAGNOSIS — M1991 Primary osteoarthritis, unspecified site: Secondary | ICD-10-CM | POA: Diagnosis not present

## 2020-09-18 DIAGNOSIS — Z6824 Body mass index (BMI) 24.0-24.9, adult: Secondary | ICD-10-CM | POA: Diagnosis not present

## 2020-09-18 DIAGNOSIS — G35 Multiple sclerosis: Secondary | ICD-10-CM | POA: Diagnosis not present

## 2020-09-18 DIAGNOSIS — S46001A Unspecified injury of muscle(s) and tendon(s) of the rotator cuff of right shoulder, initial encounter: Secondary | ICD-10-CM | POA: Diagnosis not present

## 2020-09-18 DIAGNOSIS — K219 Gastro-esophageal reflux disease without esophagitis: Secondary | ICD-10-CM | POA: Diagnosis not present

## 2020-09-23 ENCOUNTER — Ambulatory Visit (INDEPENDENT_AMBULATORY_CARE_PROVIDER_SITE_OTHER): Payer: Self-pay | Admitting: *Deleted

## 2020-09-23 ENCOUNTER — Other Ambulatory Visit: Payer: Self-pay

## 2020-09-23 VITALS — Ht 61.75 in | Wt 129.4 lb

## 2020-09-23 DIAGNOSIS — Z1211 Encounter for screening for malignant neoplasm of colon: Secondary | ICD-10-CM

## 2020-09-23 MED ORDER — NA SULFATE-K SULFATE-MG SULF 17.5-3.13-1.6 GM/177ML PO SOLN: 1.0000 | Freq: Once | ORAL | 0 refills | Status: AC

## 2020-09-23 NOTE — Progress Notes (Addendum)
Gastroenterology Pre-Procedure Review  Request Date: 09/23/2020 Requesting Physician: Dr. Hilma Favors, no previous TCS  PATIENT REVIEW QUESTIONS: The patient responded to the following health history questions as indicated:    1. Diabetes Melitis: no 2. Joint replacements in the past 12 months: no 3. Major health problems in the past 3 months: yes, shingles in May 2022 4. Has an artificial valve or MVP: no 5. Has a defibrillator: no 6. Has been advised in past to take antibiotics in advance of a procedure like teeth cleaning: no 7. Family history of colon cancer: no 8. Alcohol Use: no 9. Illicit drug Use: no 10. History of sleep apnea: no 11. History of coronary artery or other vascular stents placed within the last 12 months: no 12. History of any prior anesthesia complications: no 13. Body mass index is 23.86 kg/m.    MEDICATIONS & ALLERGIES:    Patient reports the following regarding taking any blood thinners:   Plavix?  no Aspirin? no Coumadin? no Brilinta? no Xarelto? no Eliquis? no Pradaxa? no Savaysa? no Effient? no  Patient confirms/reports the following medications:  Current Outpatient Medications  Medication Sig Dispense Refill   Cholecalciferol (VITAMIN D) 2000 UNITS CAPS Take 1 capsule by mouth every morning.     Multiple Vitamin (MULTIVITAMIN WITH MINERALS) TABS Take 1 tablet by mouth every other day.      No current facility-administered medications for this visit.    Patient confirms/reports the following allergies:  No Known Allergies  No orders of the defined types were placed in this encounter.   AUTHORIZATION INFORMATION Primary Insurance: [place],  [place] #:[id number] Pre-Cert / Josem Kaufmann required: Yes, approved online 10/17/209-1/55/2080 Pre-Cert / Josem Kaufmann #: [id number]  SCHEDULE INFORMATION: Procedure has been scheduled as follows:  Date: 10/01/2020, Time: 9:30 Location: [facility] with Dr. Gala Romney  This Gastroenterology Pre-Precedure Review Form is being  routed to the following provider(s): Aliene Altes, PA

## 2020-09-23 NOTE — Patient Instructions (Signed)
Nicole Guerrero  [birth year] MRN: [id number]     Procedure Date: 10/01/2020 Time to register: 8:30 am Place to register: [facility] Scheduled provider: Dr. Gala Romney    PREPARATION FOR COLONOSCOPY WITH SUPREP BOWEL PREP KIT  Note: Suprep Bowel Prep Kit is a split-dose (2day) regimen. Consumption of BOTH 6-ounce bottles is required for a complete prep.  Please notify us immediately if you are diabetic, take iron supplements, or if you are on Coumadin or any other blood thinners.  Please hold the following medications: n/a                                                                                                                                                  2 DAYS BEFORE PROCEDURE:  DATE: 09/29/2020   DAY: Monday Begin clear liquid diet AFTER your lunch meal. NO SOLID FOODS after this point.  1 DAY BEFORE PROCEDURE:  DATE: 09/30/2020   DAY: Tuesday Continue clear liquids the entire day - NO SOLID FOOD.   Diabetic medications adjustments for today: n/a  At 6:00pm: Complete steps 1 through 4 below, using ONE (1) 6-ounce bottle, before going to bed. Step 1:  Pour ONE (1) 6-ounce bottle of SUPREP liquid into the mixing container.  Step 2:  Add cool drinking water to the 16 ounce line on the container and mix.  Note: Dilute the solution concentrate as directed prior to use. Step 3:  DRINK ALL the liquid in the container. Step 4:  You MUST drink an additional two (2) or more 16 ounce containers of water over the next one (1) hour.   Continue clear liquids.  DAY OF PROCEDURE:   DATE: 10/01/2020   DAY: Wednesday If you take medications for your heart, blood pressure, or breathing, you may take these medications.  Diabetic medications adjustments for today: n/a  5 hours before your procedure at 4:30 am: Step 1:  Pour ONE (1) 6-ounce bottle of SUPREP liquid into the mixing container.  Step 2:  Add cool drinking water to the 16 ounce line on the container and mix.  Note: Dilute  the solution concentrate as directed prior to use. Step 3:  DRINK ALL the liquid in the container. Step 4:  You MUST drink an additional two (2) or more 16 ounce containers of water over the next one (1) hour. You MUST complete the final glass of water at least 3 hours before your colonoscopy. Nothing by mouth past 6:30 am.  You may take your morning medications with sip of water unless we have instructed otherwise.    Please see below for Dietary Information.  CLEAR LIQUIDS INCLUDE:  Water Jello (NOT red in color)   Ice Popsicles (NOT red in color)   Tea (sugar ok, no milk/cream) Powdered fruit flavored drinks  Coffee (sugar ok, no milk/cream) Gatorade/ Lemonade/ Kool-Aid  (  NOT red in color)   Juice: apple, white grape, white cranberry Soft drinks  Clear bullion, consomme, broth (fat free beef/chicken/vegetable)  Carbonated beverages (any kind)  Strained chicken noodle soup Hard Candy   Remember: Clear liquids are liquids that will allow you to see your fingers on the other side of a clear glass. Be sure liquids are NOT red in color, and not cloudy, but CLEAR.  DO NOT EAT OR DRINK ANY OF THE FOLLOWING:  Dairy products of any kind   Cranberry juice Tomato juice / V8 juice   Grapefruit juice Orange juice     Red grape juice  Do not eat any solid foods, including such foods as: cereal, oatmeal, yogurt, fruits, vegetables, creamed soups, eggs, bread, crackers, pureed foods in a blender, etc.   HELPFUL HINTS FOR DRINKING PREP SOLUTION:  Make sure prep is extremely cold. Mix and refrigerate the the morning of the prep. You may also put in the freezer.  You may try mixing some Crystal Light or Country Time Lemonade if you prefer. Mix in small amounts; add more if necessary. Try drinking through a straw Rinse mouth with water or a mouthwash between glasses, to remove after-taste. Try sipping on a cold beverage /ice/ popsicles between glasses of prep. Place a piece of sugar-free hard candy  in mouth between glasses. If you become nauseated, try consuming smaller amounts, or stretch out the time between glasses. Stop for 30-60 minutes, then slowly start back drinking.     OTHER INSTRUCTIONS  You will need a responsible adult at least 71 years of age to accompany you and drive you home. This person must remain in the waiting room during your procedure. The hospital will cancel your procedure if you do not have a responsible adult with you.   Wear loose fitting clothing that is easily removed. Leave jewelry and other valuables at home.  Remove all body piercing jewelry and leave at home. Total time from sign-in until discharge is approximately 2-3 hours. You should go home directly after your procedure and rest. You can resume normal activities the day after your procedure. The day of your procedure you should not: Drive Make legal decisions Operate machinery Drink alcohol Return to work   You may call the office (Dept: [phone]) before 5:00pm, or page the doctor on call [phone]) after 5:00pm, for further instructions, if necessary.   Insurance Information YOU WILL NEED TO CHECK WITH YOUR INSURANCE COMPANY FOR THE BENEFITS OF COVERAGE YOU HAVE FOR THIS PROCEDURE.  UNFORTUNATELY, NOT ALL INSURANCE COMPANIES HAVE BENEFITS TO COVER ALL OR PART OF THESE TYPES OF PROCEDURES.  IT IS YOUR RESPONSIBILITY TO CHECK YOUR BENEFITS, HOWEVER, WE WILL BE GLAD TO ASSIST YOU WITH ANY CODES YOUR INSURANCE COMPANY MAY NEED.    PLEASE NOTE THAT MOST INSURANCE COMPANIES WILL NOT COVER A SCREENING COLONOSCOPY FOR PEOPLE UNDER THE AGE OF 50  IF YOU HAVE BC[employer] INSURANCE, YOU MAY HAVE BENEFITS FOR A SCREENING COLONOSCOPY BUT IF POLYPS ARE FOUND THE DIAGNOSIS WILL CHANGE AND THEN YOU MAY HAVE A DEDUCTIBLE THAT WILL NEED TO BE MET. SO PLEASE MAKE SURE YOU CHECK YOUR BENEFITS FOR A SCREENING COLONOSCOPY AS WELL AS A DIAGNOSTIC COLONOSCOPY.

## 2020-09-24 NOTE — Progress Notes (Signed)
NEUROLOGY FOLLOW UP OFFICE NOTE  Nicole Guerrero [id number]  Assessment/Plan:   Clinically Isolated Syndrome - imaging stable since 2015.  I do not suspect an ongoing progressive process.  Follow up as needed.  Subjective:  Nicole Guerrero is a 70 year old right-handed African American woman with GERD and remote history of viral endocarditis who follows up for clinically isolated syndrome.   UPDATE: MRI of brain and cervical spine with and without contrast on 08/30/2020 personally reviewed and was stable compared to April 2021.  Overall feeling well.  Still limps.  Notes some pain in the right knee.   HISTORY: She is suffered from right-sided neck pain since at least 2013. The pain radiates on the right, from the shoulder up to the right side of her occipital region. It is exacerbated with movement of her head to the right. There is no shooting pain down the arm nor numbness involving the arms. She denies any numbness in the legs. She did report cramping on the right leg and right arm, which has since resolved after starting vitamin D supplements 4 vitamin D deficiency. Occasionally, she notes soreness in the right hip and her right leg as a sensation that it's a little weaker. She was treated by a local neurologist in [place] around that time. She reports an MRI of the brain at that time, but results are not available. She had undergone physical therapy which helped. More recently, the neck pain got worse. She had an MRI of the cervical spine without contrast performed on 06/05/13, which showed hyperintensity noted in the right lateral cord at the C5-6 level, measuring 5 mm on axial and 18 mm on sagittal images, not present on prior scan from 04/25/09.  Mild to moderate stenosis is noted at that level as well. She was referred to Dr. Hal Neer of neurosurgery, who thought evaluation by neurology was more appropriate.  I saw her once in consultation on 08/06/13.  At that time, I asked her to obtain  her previous MRIs on CD and bring to me for review.  She underwent blood work.  B12 was 690, Lyme antibodies negative.  ANA was borderline positive 1:40 with speckled pattern.  Sed rate 8, RF negative and ENA 9 panel (ds DNA ab, ribosomal P Protein ab, SSA/SSB antibodies, centromere ab, ENA SM ab Ser-aCnc, SM/RNP, Scl-70 ab and anti-Jo) was negative.  Plan was to have her follow up in 4 weeks and proceed with LP but she never brought her MRIs and never followed up.   She subsequently followed up with neurologist Dr. Merlene Laughter in 2019 for continued right sided weakness.  MRI of brain with and without contrast performed on 10/28/17 was personally reviewed and was normal. MRI of cervical spine with and without contrast performed on same day was personally reviewed and demonstrates far righ T2 cord signal abnormalmity at C5 and C6 extending 1.7 cm without enhancement (stable compared to prior imaging from March 2015) as well as subtle posterior T2 signal changes at T1 cord level. With contrast, findings more consistent with chronic demyelination or malacia and not neoplasm.  She subsequently underwent 3 day course of SoluMedrol in September 2019 which was ineffective.  Dr. Merlene Laughter recommended blood work and lumbar puncture for CSF analysis but again patient never followed up to have these tests performed.  In April-May 2020, she reports worsening right sided weakness and polyarthralgia including the right knee, right hip and both shoulders.  She was found to have osteoarthritis in  the right knee.   She denies history of head or neck injury.  She states that her niece and brother have multiple sclerosis.   10/03/18 MRI Brain w/wo:  Mild nonspecific chronic white matter changes, stable compared to 10/28/17. 10/03/18 MRI Cervical spine w/wo:  nonenhancing abnormal signal in right cord at C5 and C6, stable since 2015. 10/30/18 MRI Thoracic spine w/wo:  Normal appearing cord.  Disc protrusion at T8-9 with mild spinal  stenosis. 10/13/18 MRI Lumbar spine w/wo:  Normal 5632 MRI of Cervical and Thoracic spine w/wo:  unchanged T2 hyperintense lesion in the right lateral column at C5-C6 level as well as subtle T2 focus within posterior column at T1 level, which may be lesion vs artifact.  No abnormal enhancement.  Multilevel degenerative spine changes noted with mild spinal canal stenosis at C3-C6 and T8-T9. 10/24/18 CSF:  Cell count 0, glucose 65, protein 25, gram stain & culture negative, > 5 oligoclonal bands in both CSF and serum, elevated IgG index 0.87, Lyme negative 09/05/18 Serum:  ANA negative, sed rate 23, Lyme negative, NMO IgG negative.  PAST MEDICAL HISTORY: Past Medical History:  Diagnosis Date   GERD (gastroesophageal reflux disease)    Viral endocarditis     MEDICATIONS: Current Outpatient Medications on File Prior to Visit  Medication Sig Dispense Refill   calcium carbonate (TUMS - DOSED IN MG ELEMENTAL CALCIUM) 500 MG chewable tablet Chew 1 tablet by mouth as needed for indigestion or heartburn.     Cholecalciferol (VITAMIN D) 2000 UNITS CAPS Take 1 capsule by mouth every morning.     Multiple Vitamin (MULTIVITAMIN WITH MINERALS) TABS Take 1 tablet by mouth every other day.      No current facility-administered medications on file prior to visit.    ALLERGIES: No Known Allergies  FAMILY HISTORY: Family History  Problem Relation Age of Onset   Diabetes Other    Cancer Other    Hypertension Other    Hypertension Mother    Diabetes Mother       Objective:  Blood pressure 109/72, pulse 95, height 5\' 1"  (1.549 m), weight 129 lb 9.6 oz (58.8 kg), SpO2 97 %. General: No acute distress.  Patient appears well-groomed.   Head:  Normocephalic/atraumatic Eyes:  Fundi examined but not visualized Neck: supple, no paraspinal tenderness, full range of motion Heart:  Regular rate and rhythm Lungs:  Clear to auscultation bilaterally Back: No paraspinal tenderness Neurological Exam: alert and  oriented to person, place, and time. Speech fluent and not dysarthric, language intact.  CN II-XII intact. Bulk and tone normal, 4+/5 right proximal leg and 5-/5 right distal leg;  Otherwise, muscle strength 5/5 throughout.  Sensation to light touch, pinprick and vibration intact.  Deep tendon reflexes 2+ throughout, toes downgoing.  Finger to nose and heel to shin testing intact.  Right limp.  Romberg negative.   Metta Clines, DO  CC: Sharilyn Sites, MD

## 2020-09-24 NOTE — Progress Notes (Signed)
Ok to schedule.  ASA II 

## 2020-09-25 ENCOUNTER — Encounter: Payer: Self-pay | Admitting: Neurology

## 2020-09-25 ENCOUNTER — Ambulatory Visit: Payer: Medicare PPO | Admitting: Neurology

## 2020-09-25 ENCOUNTER — Other Ambulatory Visit: Payer: Self-pay | Admitting: *Deleted

## 2020-09-25 ENCOUNTER — Other Ambulatory Visit: Payer: Self-pay

## 2020-09-25 VITALS — BP 109/72 | HR 95 | Ht 61.0 in | Wt 129.6 lb

## 2020-09-25 DIAGNOSIS — G379 Demyelinating disease of central nervous system, unspecified: Secondary | ICD-10-CM | POA: Diagnosis not present

## 2020-09-25 NOTE — Patient Instructions (Signed)
Imaging has been stable since 2015.  I don't suspect anything progressive or active.

## 2020-10-01 ENCOUNTER — Encounter (HOSPITAL_COMMUNITY): Payer: Self-pay | Admitting: Internal Medicine

## 2020-10-01 ENCOUNTER — Ambulatory Visit (HOSPITAL_COMMUNITY)
Admission: RE | Admit: 2020-10-01 | Discharge: 2020-10-01 | Disposition: A | Discharge status: Home / Self Care | Payer: Medicare PPO | Attending: Internal Medicine | Admitting: Internal Medicine

## 2020-10-01 ENCOUNTER — Encounter (HOSPITAL_COMMUNITY)
Admission: RE | Disposition: A | Discharge status: Home / Self Care | Payer: Self-pay | Source: Home / Self Care | Attending: Internal Medicine

## 2020-10-01 ENCOUNTER — Other Ambulatory Visit: Payer: Self-pay

## 2020-10-01 DIAGNOSIS — Z79899 Other long term (current) drug therapy: Secondary | ICD-10-CM | POA: Diagnosis not present

## 2020-10-01 DIAGNOSIS — D122 Benign neoplasm of ascending colon: Secondary | ICD-10-CM | POA: Insufficient documentation

## 2020-10-01 DIAGNOSIS — K635 Polyp of colon: Secondary | ICD-10-CM | POA: Diagnosis not present

## 2020-10-01 DIAGNOSIS — K5730 Diverticulosis of large intestine without perforation or abscess without bleeding: Secondary | ICD-10-CM | POA: Diagnosis not present

## 2020-10-01 DIAGNOSIS — Z87891 Personal history of nicotine dependence: Secondary | ICD-10-CM | POA: Insufficient documentation

## 2020-10-01 DIAGNOSIS — Z1211 Encounter for screening for malignant neoplasm of colon: Secondary | ICD-10-CM

## 2020-10-01 HISTORY — PX: COLONOSCOPY: SHX5424

## 2020-10-01 HISTORY — PX: POLYPECTOMY: SHX5525

## 2020-10-01 SURGERY — COLONOSCOPY
Anesthesia: Moderate Sedation

## 2020-10-01 MED ORDER — ONDANSETRON HCL 4 MG/2ML IJ SOLN
INTRAMUSCULAR | Status: DC | PRN
  Administered 2020-10-01: 4 mg via INTRAVENOUS

## 2020-10-01 MED ORDER — MEPERIDINE HCL 50 MG/ML IJ SOLN
INTRAMUSCULAR | Status: AC
  Filled 2020-10-01: qty 1

## 2020-10-01 MED ORDER — STERILE WATER FOR IRRIGATION IR SOLN
Status: DC | PRN
  Administered 2020-10-01: 1.5 mL

## 2020-10-01 MED ORDER — MIDAZOLAM HCL 5 MG/5ML IJ SOLN
INTRAMUSCULAR | Status: DC | PRN
  Administered 2020-10-01 (×2): 1 mg via INTRAVENOUS

## 2020-10-01 MED ORDER — MEPERIDINE HCL 100 MG/ML IJ SOLN
INTRAMUSCULAR | Status: DC | PRN
  Administered 2020-10-01: 25 mg via INTRAVENOUS

## 2020-10-01 MED ORDER — MIDAZOLAM HCL 5 MG/5ML IJ SOLN
INTRAMUSCULAR | Status: AC
  Filled 2020-10-01: qty 10

## 2020-10-01 MED ORDER — SODIUM CHLORIDE 0.9 % IV SOLN: Freq: Once | INTRAVENOUS | Status: DC

## 2020-10-01 MED ORDER — ONDANSETRON HCL 4 MG/2ML IJ SOLN
INTRAMUSCULAR | Status: AC
  Filled 2020-10-01: qty 2

## 2020-10-01 MED ORDER — SODIUM CHLORIDE 0.9 % IV SOLN: INTRAVENOUS | Status: DC

## 2020-10-01 NOTE — Op Note (Signed)
[facility] Patient Name: Nicole Guerrero Procedure Date: 10/01/2020 9:03 AM MRN: [id number] Date of Birth: Aug 01, 1950 Attending MD: Norvel Richards , MD CSN: [id number] Age: 70 Admit Type: Outpatient Procedure:                Colonoscopy Indications:              Screening for colorectal malignant neoplasm Providers:                Norvel Richards, MD, Otis Peak B. Sharon Seller, RN,                            Raphael Gibney, Technician Referring MD:              Medicines:                Midazolam 2 mg IV, Meperidine 25 mg IV Complications:            No immediate complications. Estimated Blood Loss:     Estimated blood loss was minimal. Procedure:                Pre-Anesthesia Assessment:                           - Prior to the procedure, a History and Physical                            was performed, and patient medications and                            allergies were reviewed. The patient's tolerance of                            previous anesthesia was also reviewed. The risks                            and benefits of the procedure and the sedation                            options and risks were discussed with the patient.                            All questions were answered, and informed consent                            was obtained. Prior Anticoagulants: The patient has                            taken no previous anticoagulant or antiplatelet                            agents. ASA Grade Assessment: II - A patient with                            mild systemic disease. After reviewing the risks  and benefits, the patient was deemed in                            satisfactory condition to undergo the procedure.                           After obtaining informed consent, the colonoscope                            was passed under direct vision. Throughout the                            procedure, the patient's blood pressure, pulse, and                             oxygen saturations were monitored continuously. The                            [id number]-[id number] ([id number]) scope was introduced through                            the anus and advanced to the the cecum, identified                            by appendiceal orifice and ileocecal valve. The                            colonoscopy was performed without difficulty. The                            patient tolerated the procedure well. The quality                            of the bowel preparation was adequate. Scope In: 9:23:41 AM Scope Out: 9:33:36 AM Scope Withdrawal Time: 0 hours 6 minutes 31 seconds  Total Procedure Duration: 0 hours 9 minutes 55 seconds  Findings:      The perianal and digital rectal examinations were normal.      A 5 mm polyp was found in the ascending colon. The polyp was       semi-pedunculated. The polyp was removed with a cold snare. Resection       and retrieval were complete. Estimated blood loss was minimal.      Scattered small and large-mouthed diverticula were found in the sigmoid       colon and descending colon.      The exam was otherwise without abnormality on direct and retroflexion       views. Impression:               - One 5 mm polyp in the ascending colon, removed                            with a cold snare. Resected and retrieved.                           -  Diverticulosis in the sigmoid colon and in the                            descending colon.                           - The examination was otherwise normal on direct                            and retroflexion views. Moderate Sedation:      Moderate (conscious) sedation was administered by the endoscopy nurse       and supervised by the endoscopist. The following parameters were       monitored: oxygen saturation, heart rate, blood pressure, respiratory       rate, EKG, adequacy of pulmonary ventilation, and response to care. Recommendation:           - Patient has a contact  number available for                            emergencies. The signs and symptoms of potential                            delayed complications were discussed with the                            patient. Return to normal activities tomorrow.                            Written discharge instructions were provided to the                            patient.                           - Resume previous diet.                           - Continue present medications.                           - Repeat colonoscopy date to be determined after                            pending pathology results are reviewed for                            surveillance based on pathology results.                           - Return to GI office after studies are complete. Procedure Code(s):        --- Professional ---                           [phone], Colonoscopy, flexible; with removal of  tumor(s), polyp(s), or other lesion(s) by snare                            technique Diagnosis Code(s):        --- Professional ---                           Z12.11, Encounter for screening for malignant                            neoplasm of colon                           K63.5, Polyp of colon                           K57.30, Diverticulosis of large intestine without                            perforation or abscess without bleeding CPT copyright 2019 American Medical Association. All rights reserved. The codes documented in this report are preliminary and upon coder review may  be revised to meet current compliance requirements. Cristopher Estimable. Camyra Vaeth, MD Norvel Richards, MD 10/01/2020 9:41:59 AM This report has been signed electronically. Number of Addenda: 0

## 2020-10-01 NOTE — H&P (Signed)
@LOGO @   Primary Care Physician:  Sharilyn Sites, MD Primary Gastroenterologist:  Dr. Gala Romney  Pre-Procedure History & Physical: HPI:  Nicole Guerrero is a 70 y.o. female is here for a screening colonoscopy.  No prior colonoscopy no GI symptoms.  No family history of colon cancer.  Past Medical History:  Diagnosis Date   GERD (gastroesophageal reflux disease)    Viral endocarditis     Past Surgical History:  Procedure Laterality Date   TUBAL LIGATION      Prior to Admission medications   Medication Sig Start Date End Date Taking? Authorizing Provider  calcium carbonate (TUMS - DOSED IN MG ELEMENTAL CALCIUM) 500 MG chewable tablet Chew 1 tablet by mouth daily as needed for indigestion or heartburn.   Yes [provider]  Cholecalciferol (VITAMIN D) 2000 UNITS CAPS Take 2,000 Units by mouth every morning.   Yes [provider]  Multiple Vitamin (MULTIVITAMIN WITH MINERALS) TABS Take 1 tablet by mouth every other day.    Yes [provider]    Allergies as of 09/25/2020   (No Known Allergies)    Family History  Problem Relation Age of Onset   Diabetes Other    Cancer Other    Hypertension Other    Hypertension Mother    Diabetes Mother     Social History   Socioeconomic History   Marital status: Divorced    Spouse name: Not on file   Number of children: Not on file   Years of education: Not on file   Highest education level: Not on file  Occupational History   Not on file  Tobacco Use   Smoking status: Former    Packs/day: 0.03    Years: 6.00    Pack years: 0.18    Types: Cigarettes    Quit date: 03/22/1997    Years since quitting: 23.5   Smokeless tobacco: Never  Vaping Use   Vaping Use: Never used  Substance and Sexual Activity   Alcohol use: No   Drug use: No   Sexual activity: Not Currently  Other Topics Concern   Not on file  Social History Narrative   Lives alone one level; R handed; college grad; part time Chiropractor; 2  glasses tea a day; some exercise - is active   Social Determinants of Radio broadcast assistant Strain: Not on file  Food Insecurity: Not on file  Transportation Needs: Not on file  Physical Activity: Not on file  Stress: Not on file  Social Connections: Not on file  Intimate Partner Violence: Not on file    Review of Systems: See HPI, otherwise negative ROS  Physical Exam: BP 123/72   Pulse 70   Temp 98.3 F (36.8 C) (Oral)   Resp 14   Ht 5\' 1"  (1.549 m)   Wt 58.8 kg   SpO2 97%   BMI 24.49 kg/m  General:   Alert,  Well-developed, well-nourished, pleasant and cooperative in NAD Neck:  Supple; no masses or thyromegaly. Lungs:  Clear throughout to auscultation.   No wheezes, crackles, or rhonchi. No acute distress. Heart:  Regular rate and rhythm; no murmurs, clicks, rubs,  or gallops. Abdomen:  Soft, nontender and nondistended. No masses, hepatosplenomegaly or hernias noted. Normal bowel sounds, without guarding, and without rebound.   Impression/Plan: Nicole Guerrero is now here to undergo a screening colonoscopy.  First-ever average risk screening examination  Risks, benefits, limitations, imponderables and alternatives regarding colonoscopy have been reviewed with the  patient. Questions have been answered. All parties agreeable.     Notice:  This dictation was prepared with Dragon dictation along with smaller phrase technology. Any transcriptional errors that result from this process are unintentional and may not be corrected upon review.

## 2020-10-01 NOTE — Discharge Instructions (Signed)
  Colonoscopy Discharge Instructions  Read the instructions outlined below and refer to this sheet in the next few weeks. These discharge instructions provide you with general information on caring for yourself after you leave the hospital. Your doctor may also give you specific instructions. While your treatment has been planned according to the most current medical practices available, unavoidable complications occasionally occur. If you have any problems or questions after discharge, call Dr. Gala Romney at [phone]. ACTIVITY You may resume your regular activity, but move at a slower pace for the next 24 hours.  Take frequent rest periods for the next 24 hours.  Walking will help get rid of the air and reduce the bloated feeling in your belly (abdomen).  No driving for 24 hours (because of the medicine (anesthesia) used during the test).   Do not sign any important legal documents or operate any machinery for 24 hours (because of the anesthesia used during the test).  NUTRITION Drink plenty of fluids.  You may resume your normal diet as instructed by your doctor.  Begin with a light meal and progress to your normal diet. Heavy or fried foods are harder to digest and may make you feel sick to your stomach (nauseated).  Avoid alcoholic beverages for 24 hours or as instructed.  MEDICATIONS You may resume your normal medications unless your doctor tells you otherwise.  WHAT YOU CAN EXPECT TODAY Some feelings of bloating in the abdomen.  Passage of more gas than usual.  Spotting of blood in your stool or on the toilet paper.  IF YOU HAD POLYPS REMOVED DURING THE COLONOSCOPY: No aspirin products for 7 days or as instructed.  No alcohol for 7 days or as instructed.  Eat a soft diet for the next 24 hours.  FINDING OUT THE RESULTS OF YOUR TEST Not all test results are available during your visit. If your test results are not back during the visit, make an appointment with your caregiver to find out the  results. Do not assume everything is normal if you have not heard from your caregiver or the medical facility. It is important for you to follow up on all of your test results.  SEEK IMMEDIATE MEDICAL ATTENTION IF: You have more than a spotting of blood in your stool.  Your belly is swollen (abdominal distention).  You are nauseated or vomiting.  You have a temperature over 101.  You have abdominal pain or discomfort that is severe or gets worse throughout the day.    1 polyp removed in your colon  Colon polyp and diverticulosis information provided  Further recommendations to follow pending review of pathology report  At patient request, I called Vinson Moselle at [phone] -reviewed findings and recommendations

## 2020-10-02 ENCOUNTER — Encounter: Payer: Self-pay | Admitting: Internal Medicine

## 2020-10-02 LAB — SURGICAL PATHOLOGY

## 2020-10-03 ENCOUNTER — Telehealth: Payer: Self-pay

## 2020-10-03 IMAGING — MR MR CERVICAL SPINE WO/W CM
8 series · 48 of 48 positions shown · IV contrast (12 ml Multihance)
Comparison: MRI of the cervical spine October 28, 2017; MRI of the
thoracic spine although stent 1717

CLINICAL DATA: Cord lesion follow-up. Low back tenderness, right
arm and leg weakness

EXAM:
MRI OF THE CERVICAL AND LUMBAR SPINE WITHOUT AND WITH CONTRAST
TECHNIQUE: Multisequence MR imaging of the spine from the cervical and thoracic
spine was performed prior to and following IV contrast
administration.
CONTRAST:  12mL MULTIHANCE GADOBENATE DIMEGLUMINE 529 MG/ML IV SOLN

[Series 3: T1 · sagittal · 3.0mm · 0.82mm/px · 4 of 12 slices shown (1 of 2)]
[im 1/12]
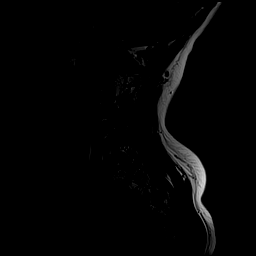
[im 4/12]
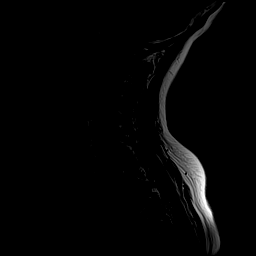
[im 8/12]
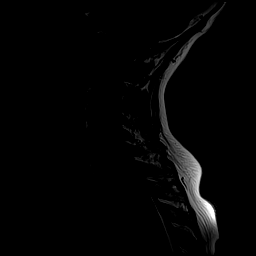
[im 12/12]
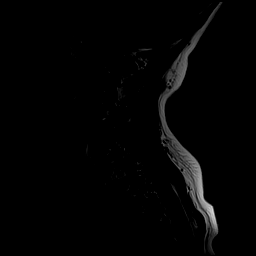

[Series 4: STIR · sagittal · 3.0mm · 0.82mm/px · 4 of 12 slices shown]
[im 1/12]
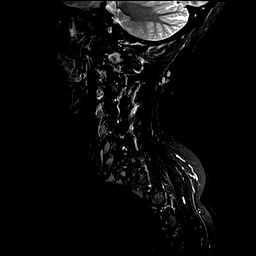
[im 4/12]
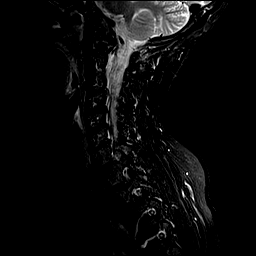
[im 8/12]
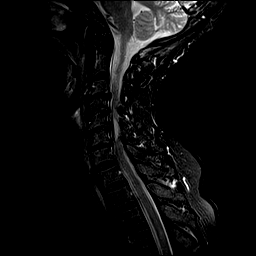
[im 12/12]
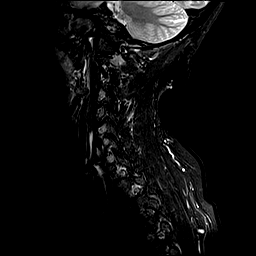

[Series 5: GRE · axial · 3.0mm · 0.74mm/px · z∈[-51,+43]mm · 8 of 26 slices shown]
[im 1/26]
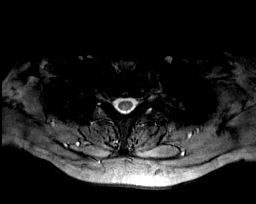
[im 4/26]
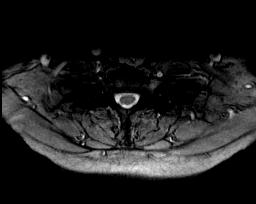
[im 8/26]
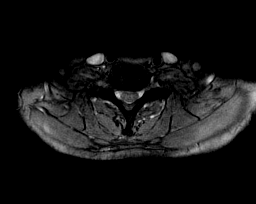
[im 11/26]
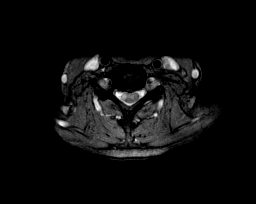
[im 15/26]
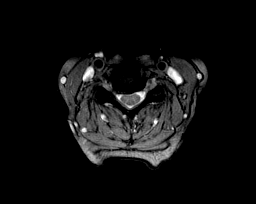
[im 18/26]
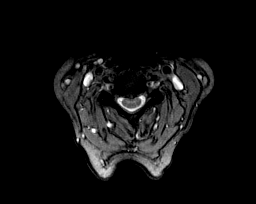
[im 22/26]
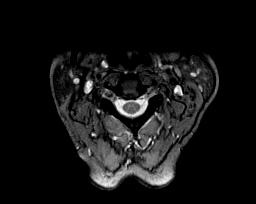
[im 26/26]
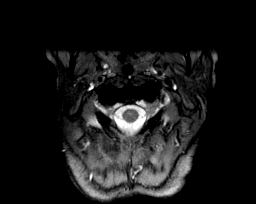

[Series 6: T2 · axial · 3.0mm · 0.74mm/px · z∈[-51,+43]mm · 8 of 26 slices shown]
[im 1/26]
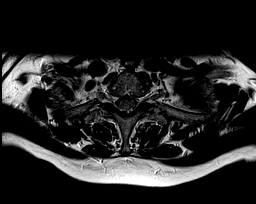
[im 4/26]
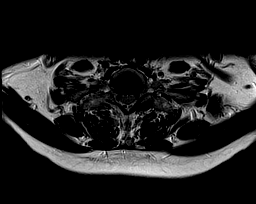
[im 8/26]
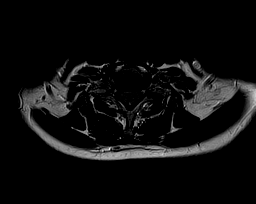
[im 11/26]
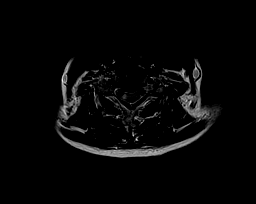
[im 15/26]
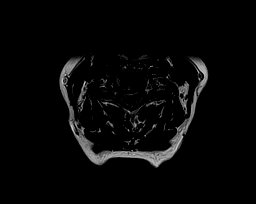
[im 18/26]
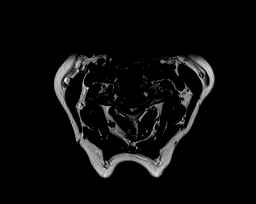
[im 22/26]
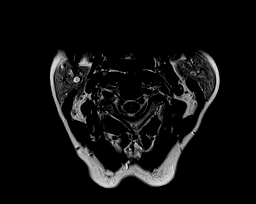
[im 26/26]
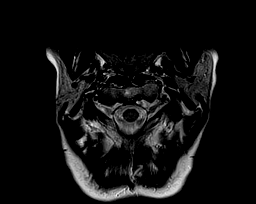

[Series 7: T1 · axial · non-contrast · 3.0mm · 0.78mm/px · z∈[-52,+42]mm · 8 of 26 slices shown (2 of 2)]
[im 1/26]
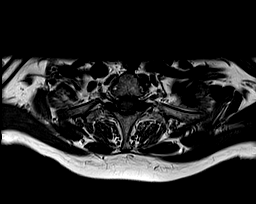
[im 4/26]
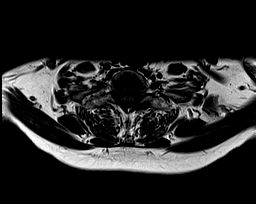
[im 8/26]
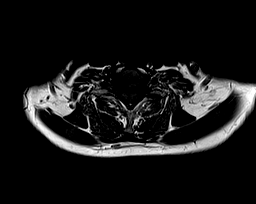
[im 11/26]
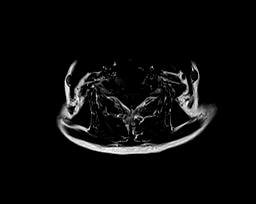
[im 15/26]
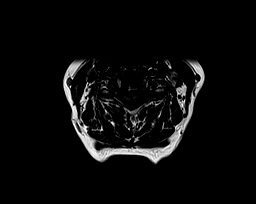
[im 18/26]
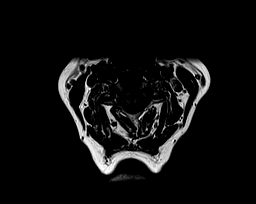
[im 22/26]
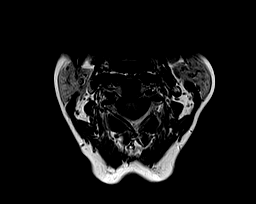
[im 26/26]
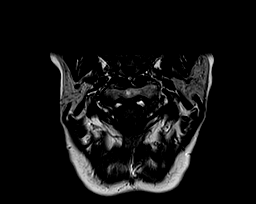

[Series 8: T2 post-contrast · sagittal · 3.0mm · 0.82mm/px · 4 of 12 slices shown]
[im 1/12]
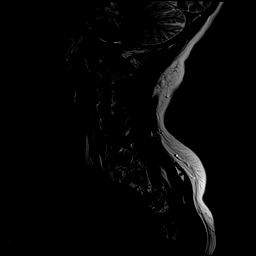
[im 4/12]
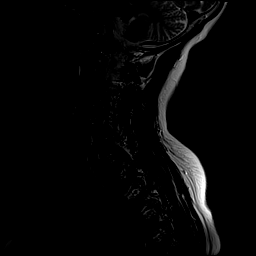
[im 8/12]
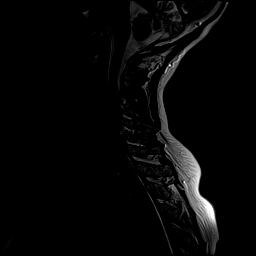
[im 12/12]
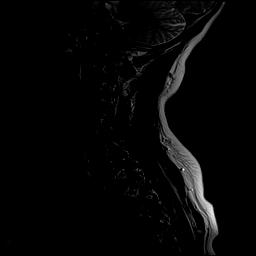

[Series 9: T1 post-contrast · axial · 3.0mm · 0.78mm/px · z∈[-52,+42]mm · 8 of 26 slices shown]
[im 1/26]
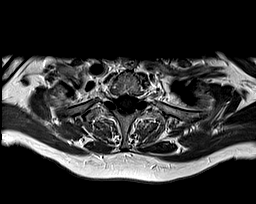
[im 4/26]
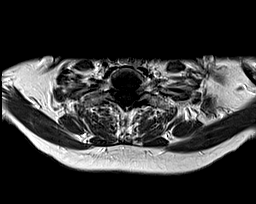
[im 8/26]
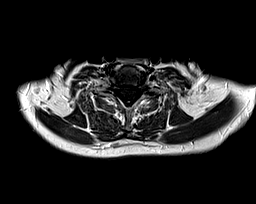
[im 11/26]
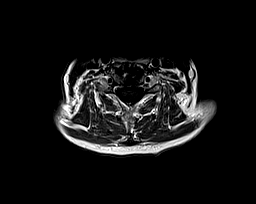
[im 15/26]
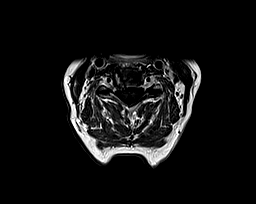
[im 18/26]
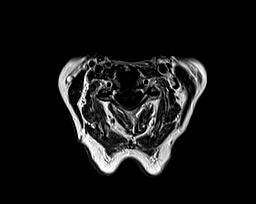
[im 22/26]
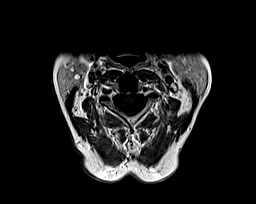
[im 26/26]
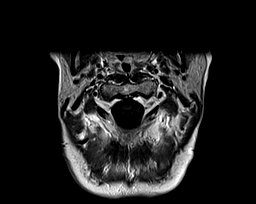

[Series 10: T1 fat-sat post-contrast · sagittal · 3.0mm · 0.82mm/px · 4 of 12 slices shown]
[im 1/12]
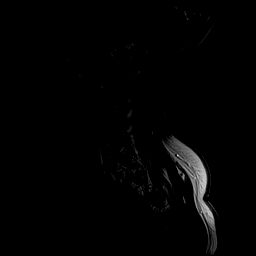
[im 4/12]
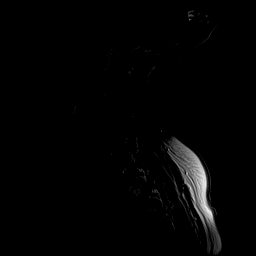
[im 8/12]
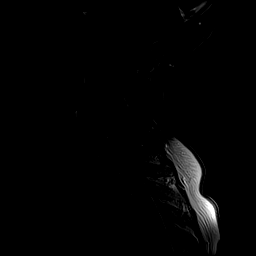
[im 12/12]
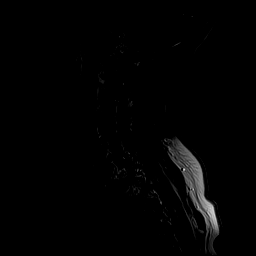

[48 of 48 positions shown; findings below may reference images not displayed]

FINDINGS: MRI CERVICAL SPINE FINDINGS

Alignment: Physiologic.

Vertebrae: No fracture, evidence of discitis, or bone lesion.

Cord: Again seen is a T2 hyperintense lesion in the lateral column
at the level of C5-6 on the right side without evidence of contrast
enhancement. The lesion appears similar in shape and when compared
to prior. No new cervical lesion identified.

Posterior Fossa, vertebral arteries, paraspinal tissues: Negative.

Disc levels:

C2-3: No spinal canal or neural foraminal stenosis.

C3-4: Posterior disc protrusion causing mild flattening of the
spinal cord without cord signal abnormality. Uncovertebral and facet
degenerative changes resulting in mild right neural foraminal
narrowing.

C4-5: Posterior disc protrusion causing mild flattening of the
anterior cord without cord signal abnormality. Vertebral facet
degenerative changes resulting in moderate right neural foraminal
narrowing.

C5-6: Posterior disc protrusion causing mild flattening of the
anterior cord and mild spinal canal stenosis. There is increased T2
signal in the right lateral column, as described above.
Uncovertebral and facet degenerative changes resulting in moderate
right neural foraminal narrowing.

C6-7: Posterior disc protrusion causing mild flattening of the
anterior cord without cord signal. Uncovertebral and facet
degenerative changes resulting in moderate bilateral neural
foraminal narrowing.

C7-T1: No spinal canal or neural foraminal stenosis.

MRI THORACIC SPINE FINDINGS

Alignment:  Physiologic.

Vertebrae: No fracture, evidence of discitis, or bone lesion.

Cord: Subtle focus of T2 hyperintensity within the posterior column
of the cord at the level of T1 (series 5, image 26; series 8, image
1) not clearly seen on the sagittal images. No focus of abnormal
contrast enhancement seen otherwise, the thoracic cord has normal
signal characteristics.

Paraspinal and other soft tissues: Negative.

Disc levels:

T1-2 through T7-T8: No spinal canal or neural foraminal stenosis.

T8-9: Posterior disc protrusion causing mild flattening of the
spinal cord without cord signal abnormality, not significantly
changed from prior MRI. No significant neural foraminal narrowing.

T9-10: No spinal canal or neural foraminal stenosis.

T10-11: Facet degenerative changes. No significant spinal canal or
neural foraminal stenosis.

T11-12: No spinal canal or neural foraminal stenosis.
IMPRESSION: 1. Unchanged T2 hyperintense lesion in the right lateral column at
the level of C5-C6, which may reflect demyelination or myelomalacia.
2. Unchanged multilevel degenerative changes of the cervical and
thoracic spine as described above with mild spinal canal stenosis at
C3-C6 and T8-T9.
3. Subtle focus of T2 hyperintensity within the posterior column of
the thoracic cord at the level of T1 on axial T2 gradient images,
not well seen on the sagittal images. Question small demyelinating
lesion versus artifact. No abnormal contrast enhancement.

## 2020-10-03 IMAGING — MR MR THORACIC SPINE WO/W CM
4 of 9 series · 18 of 48 positions shown · IV contrast (multihance)
Comparison: MRI of the cervical spine October 28, 2017; MRI of the
thoracic spine although stent 1717

CLINICAL DATA: Cord lesion follow-up. Low back tenderness, right
arm and leg weakness

EXAM:
MRI OF THE CERVICAL AND LUMBAR SPINE WITHOUT AND WITH CONTRAST
TECHNIQUE: Multisequence MR imaging of the spine from the cervical and thoracic
spine was performed prior to and following IV contrast
administration.
CONTRAST:  12mL MULTIHANCE GADOBENATE DIMEGLUMINE 529 MG/ML IV SOLN

[Series 6: T1 · sagittal · 4.0mm · 0.39mm/px · 3 of 12 slices shown]
[im 1/12]
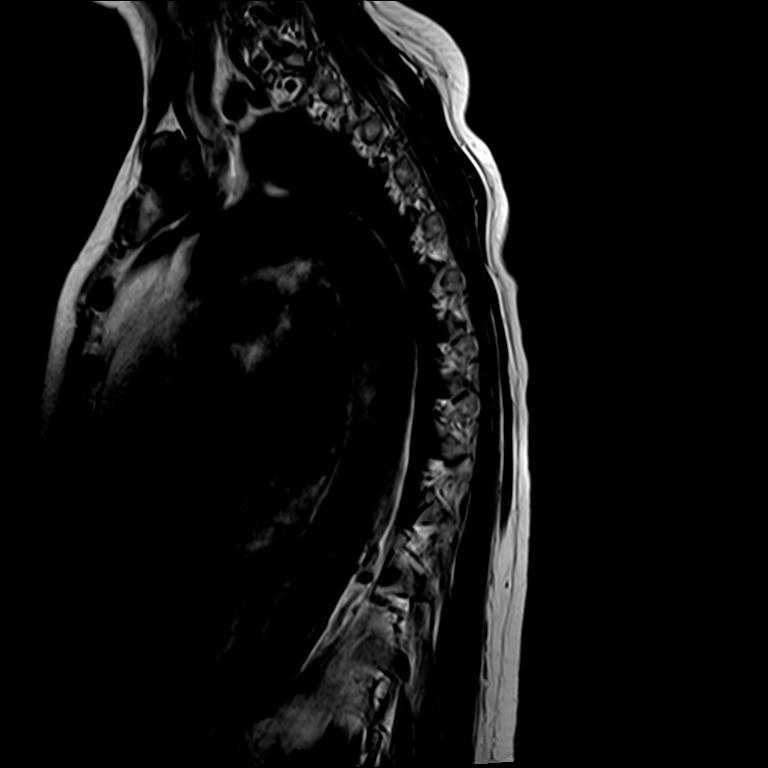
[im 6/12]
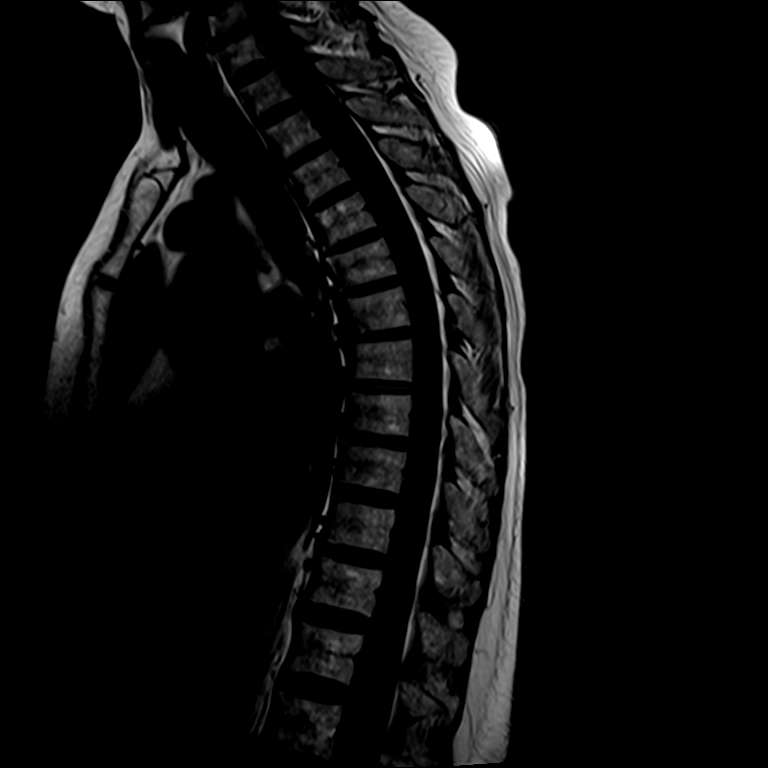
[im 12/12]
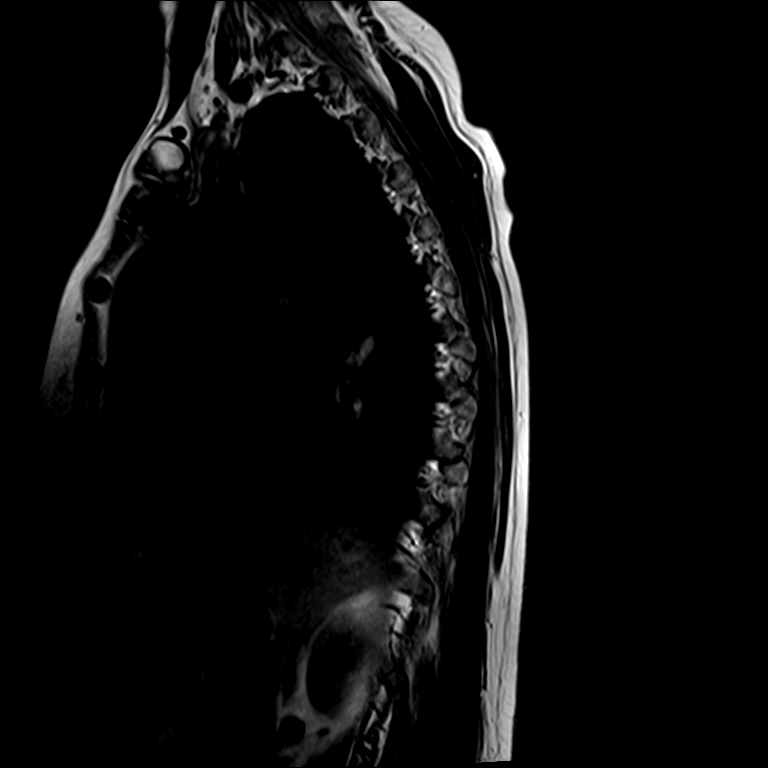

[Series 7: T2 · axial · 4.0mm · 0.78mm/px · z∈[-230,-70]mm · 8 of 33 slices shown (1 of 2)]
[im 1/33]
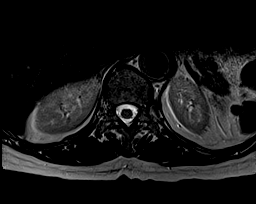
[im 5/33]
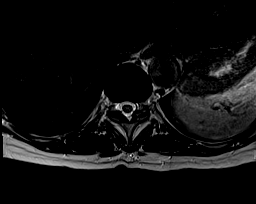
[im 10/33]
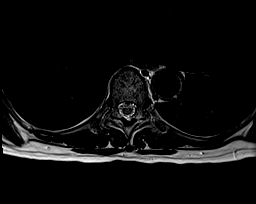
[im 14/33]
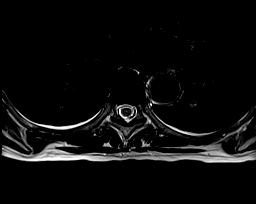
[im 19/33]
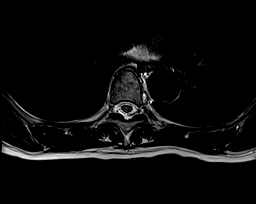
[im 23/33]
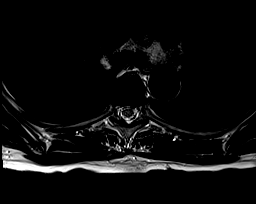
[im 28/33]
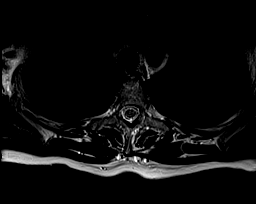
[im 33/33]
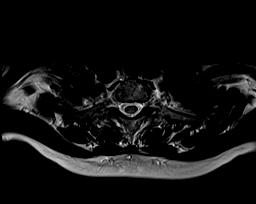

[Series 8: T2 · axial · 4.0mm · 0.78mm/px · z∈[-230,-97]mm · 4 of 33 slices shown (2 of 2)]
[im 1/33]
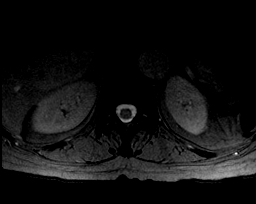
[im 5/33]
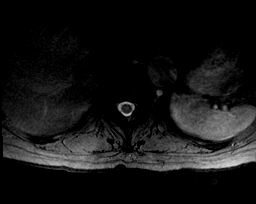
[im 19/33]
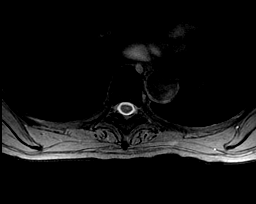
[im 28/33]
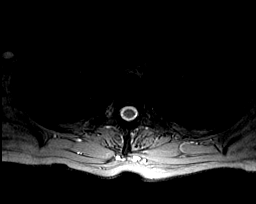

[Series 10: T2 post-contrast · sagittal · 4.0mm · 0.33mm/px · 3 of 12 slices shown]
[im 1/12]
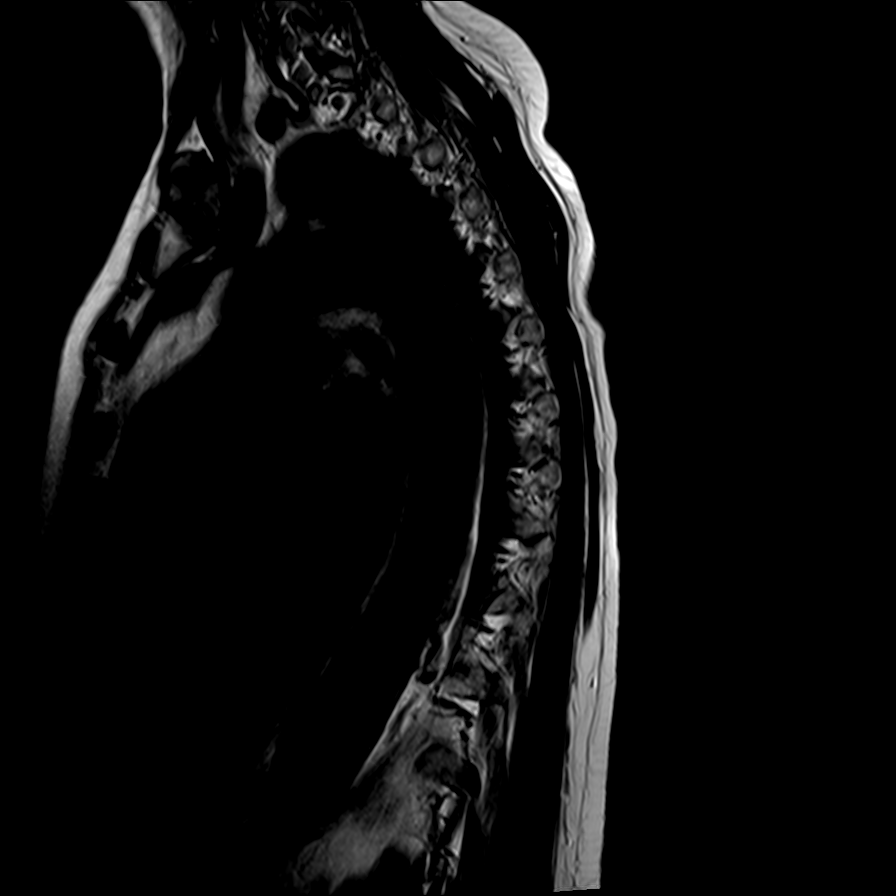
[im 6/12]
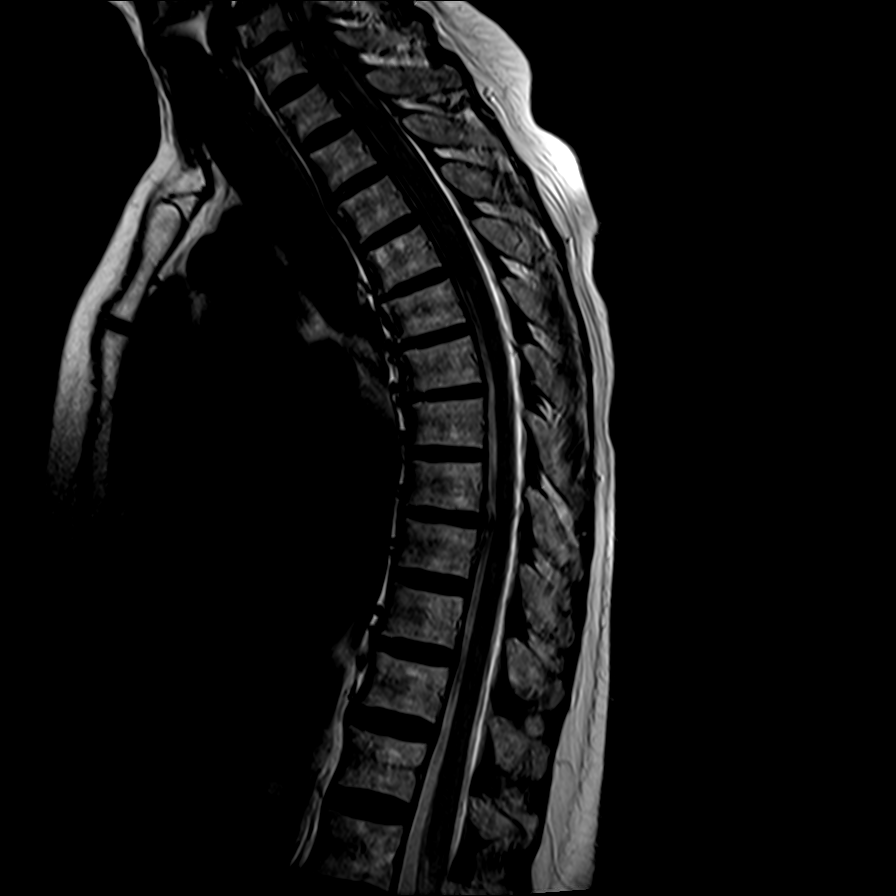
[im 12/12]
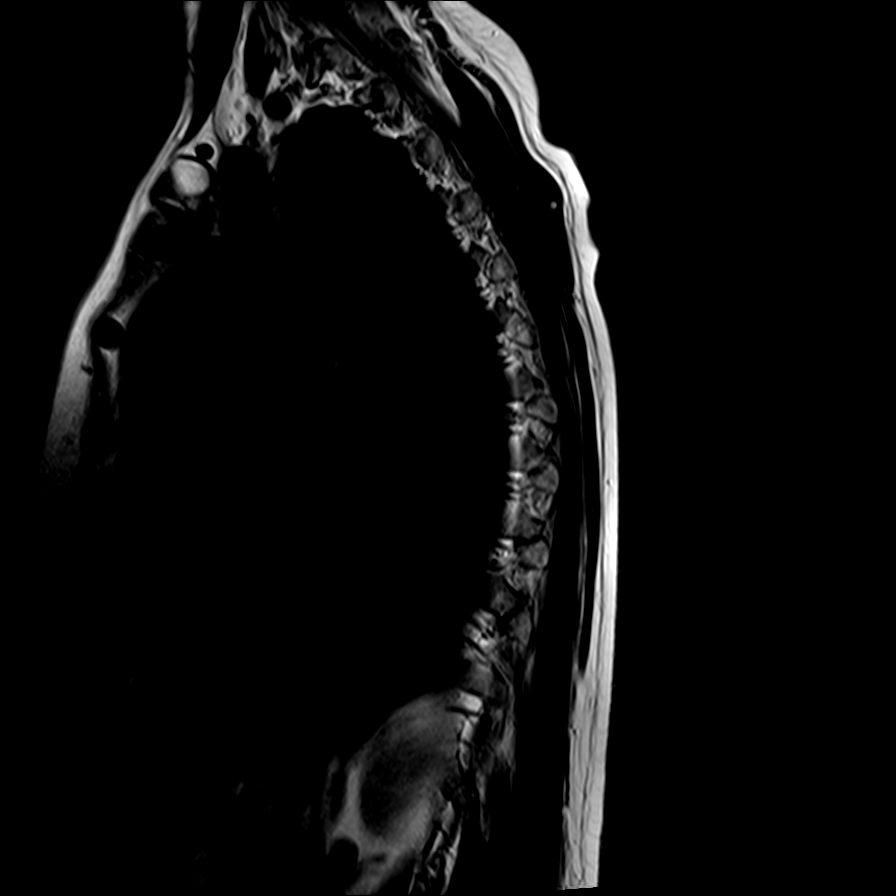

[18 of 48 positions shown; findings below may reference images not displayed]

FINDINGS: MRI CERVICAL SPINE FINDINGS

Alignment: Physiologic.

Vertebrae: No fracture, evidence of discitis, or bone lesion.

Cord: Again seen is a T2 hyperintense lesion in the lateral column
at the level of C5-6 on the right side without evidence of contrast
enhancement. The lesion appears similar in shape and when compared
to prior. No new cervical lesion identified.

Posterior Fossa, vertebral arteries, paraspinal tissues: Negative.

Disc levels:

C2-3: No spinal canal or neural foraminal stenosis.

C3-4: Posterior disc protrusion causing mild flattening of the
spinal cord without cord signal abnormality. Uncovertebral and facet
degenerative changes resulting in mild right neural foraminal
narrowing.

C4-5: Posterior disc protrusion causing mild flattening of the
anterior cord without cord signal abnormality. Vertebral facet
degenerative changes resulting in moderate right neural foraminal
narrowing.

C5-6: Posterior disc protrusion causing mild flattening of the
anterior cord and mild spinal canal stenosis. There is increased T2
signal in the right lateral column, as described above.
Uncovertebral and facet degenerative changes resulting in moderate
right neural foraminal narrowing.

C6-7: Posterior disc protrusion causing mild flattening of the
anterior cord without cord signal. Uncovertebral and facet
degenerative changes resulting in moderate bilateral neural
foraminal narrowing.

C7-T1: No spinal canal or neural foraminal stenosis.

MRI THORACIC SPINE FINDINGS

Alignment:  Physiologic.

Vertebrae: No fracture, evidence of discitis, or bone lesion.

Cord: Subtle focus of T2 hyperintensity within the posterior column
of the cord at the level of T1 (series 5, image 26; series 8, image
1) not clearly seen on the sagittal images. No focus of abnormal
contrast enhancement seen otherwise, the thoracic cord has normal
signal characteristics.

Paraspinal and other soft tissues: Negative.

Disc levels:

T1-2 through T7-T8: No spinal canal or neural foraminal stenosis.

T8-9: Posterior disc protrusion causing mild flattening of the
spinal cord without cord signal abnormality, not significantly
changed from prior MRI. No significant neural foraminal narrowing.

T9-10: No spinal canal or neural foraminal stenosis.

T10-11: Facet degenerative changes. No significant spinal canal or
neural foraminal stenosis.

T11-12: No spinal canal or neural foraminal stenosis.
IMPRESSION: 1. Unchanged T2 hyperintense lesion in the right lateral column at
the level of C5-C6, which may reflect demyelination or myelomalacia.
2. Unchanged multilevel degenerative changes of the cervical and
thoracic spine as described above with mild spinal canal stenosis at
C3-C6 and T8-T9.
3. Subtle focus of T2 hyperintensity within the posterior column of
the thoracic cord at the level of T1 on axial T2 gradient images,
not well seen on the sagittal images. Question small demyelinating
lesion versus artifact. No abnormal contrast enhancement.

## 2020-10-03 NOTE — Telephone Encounter (Signed)
Result letter mailed to the pt.

## 2020-10-07 ENCOUNTER — Encounter (HOSPITAL_COMMUNITY): Payer: Self-pay | Admitting: Internal Medicine

## 2020-10-13 ENCOUNTER — Other Ambulatory Visit: Payer: Self-pay

## 2020-10-13 ENCOUNTER — Ambulatory Visit (HOSPITAL_COMMUNITY)
Admission: RE | Admit: 2020-10-13 | Discharge: 2020-10-13 | Disposition: A | Discharge status: Home / Self Care | Payer: Medicare PPO | Source: Ambulatory Visit | Attending: Internal Medicine | Admitting: Internal Medicine

## 2020-10-13 DIAGNOSIS — M25511 Pain in right shoulder: Secondary | ICD-10-CM | POA: Insufficient documentation

## 2020-10-21 DIAGNOSIS — M19011 Primary osteoarthritis, right shoulder: Secondary | ICD-10-CM | POA: Diagnosis not present

## 2020-10-21 DIAGNOSIS — M75121 Complete rotator cuff tear or rupture of right shoulder, not specified as traumatic: Secondary | ICD-10-CM | POA: Diagnosis not present

## 2020-10-21 DIAGNOSIS — M25511 Pain in right shoulder: Secondary | ICD-10-CM | POA: Diagnosis not present

## 2020-11-06 DIAGNOSIS — I499 Cardiac arrhythmia, unspecified: Secondary | ICD-10-CM | POA: Diagnosis not present

## 2020-11-06 DIAGNOSIS — Z820 Family history of epilepsy and other diseases of the nervous system: Secondary | ICD-10-CM | POA: Diagnosis not present

## 2020-11-06 DIAGNOSIS — F439 Reaction to severe stress, unspecified: Secondary | ICD-10-CM | POA: Diagnosis not present

## 2020-11-06 DIAGNOSIS — R030 Elevated blood-pressure reading, without diagnosis of hypertension: Secondary | ICD-10-CM | POA: Diagnosis not present

## 2020-11-06 DIAGNOSIS — Z803 Family history of malignant neoplasm of breast: Secondary | ICD-10-CM | POA: Diagnosis not present

## 2020-11-06 DIAGNOSIS — Z636 Dependent relative needing care at home: Secondary | ICD-10-CM | POA: Diagnosis not present

## 2020-11-06 DIAGNOSIS — Z8249 Family history of ischemic heart disease and other diseases of the circulatory system: Secondary | ICD-10-CM | POA: Diagnosis not present

## 2020-11-06 DIAGNOSIS — M1990 Unspecified osteoarthritis, unspecified site: Secondary | ICD-10-CM | POA: Diagnosis not present

## 2020-11-06 DIAGNOSIS — Z833 Family history of diabetes mellitus: Secondary | ICD-10-CM | POA: Diagnosis not present

## 2020-11-10 DIAGNOSIS — M75121 Complete rotator cuff tear or rupture of right shoulder, not specified as traumatic: Secondary | ICD-10-CM | POA: Diagnosis not present

## 2020-11-10 DIAGNOSIS — M19011 Primary osteoarthritis, right shoulder: Secondary | ICD-10-CM | POA: Diagnosis not present

## 2020-12-10 ENCOUNTER — Ambulatory Visit: Payer: Medicare PPO | Admitting: Neurology

## 2020-12-13 DIAGNOSIS — M25511 Pain in right shoulder: Secondary | ICD-10-CM | POA: Diagnosis not present

## 2021-01-14 DIAGNOSIS — H40023 Open angle with borderline findings, high risk, bilateral: Secondary | ICD-10-CM | POA: Diagnosis not present

## 2021-01-14 DIAGNOSIS — H5213 Myopia, bilateral: Secondary | ICD-10-CM | POA: Diagnosis not present

## 2021-01-14 DIAGNOSIS — H2513 Age-related nuclear cataract, bilateral: Secondary | ICD-10-CM | POA: Diagnosis not present

## 2021-01-20 DIAGNOSIS — M94211 Chondromalacia, right shoulder: Secondary | ICD-10-CM | POA: Diagnosis not present

## 2021-01-20 DIAGNOSIS — M75121 Complete rotator cuff tear or rupture of right shoulder, not specified as traumatic: Secondary | ICD-10-CM | POA: Diagnosis not present

## 2021-01-20 DIAGNOSIS — S46111A Strain of muscle, fascia and tendon of long head of biceps, right arm, initial encounter: Secondary | ICD-10-CM | POA: Diagnosis not present

## 2021-01-20 DIAGNOSIS — M7541 Impingement syndrome of right shoulder: Secondary | ICD-10-CM | POA: Diagnosis not present

## 2021-01-20 DIAGNOSIS — G8918 Other acute postprocedural pain: Secondary | ICD-10-CM | POA: Diagnosis not present

## 2021-01-20 DIAGNOSIS — M19011 Primary osteoarthritis, right shoulder: Secondary | ICD-10-CM | POA: Diagnosis not present

## 2021-02-02 DIAGNOSIS — Z4789 Encounter for other orthopedic aftercare: Secondary | ICD-10-CM | POA: Diagnosis not present

## 2021-02-03 ENCOUNTER — Ambulatory Visit: Payer: Medicare PPO | Admitting: Neurology

## 2021-02-23 DIAGNOSIS — M25611 Stiffness of right shoulder, not elsewhere classified: Secondary | ICD-10-CM | POA: Diagnosis not present

## 2021-02-23 DIAGNOSIS — R293 Abnormal posture: Secondary | ICD-10-CM | POA: Diagnosis not present

## 2021-02-23 DIAGNOSIS — Z4789 Encounter for other orthopedic aftercare: Secondary | ICD-10-CM | POA: Diagnosis not present

## 2021-02-23 DIAGNOSIS — R531 Weakness: Secondary | ICD-10-CM | POA: Diagnosis not present

## 2021-02-23 DIAGNOSIS — M25511 Pain in right shoulder: Secondary | ICD-10-CM | POA: Diagnosis not present

## 2021-03-04 DIAGNOSIS — M25611 Stiffness of right shoulder, not elsewhere classified: Secondary | ICD-10-CM | POA: Diagnosis not present

## 2021-03-04 DIAGNOSIS — R531 Weakness: Secondary | ICD-10-CM | POA: Diagnosis not present

## 2021-03-04 DIAGNOSIS — Z4789 Encounter for other orthopedic aftercare: Secondary | ICD-10-CM | POA: Diagnosis not present

## 2021-03-04 DIAGNOSIS — M25511 Pain in right shoulder: Secondary | ICD-10-CM | POA: Diagnosis not present

## 2021-03-04 DIAGNOSIS — R293 Abnormal posture: Secondary | ICD-10-CM | POA: Diagnosis not present

## 2021-03-11 DIAGNOSIS — M25511 Pain in right shoulder: Secondary | ICD-10-CM | POA: Diagnosis not present

## 2021-03-11 DIAGNOSIS — R531 Weakness: Secondary | ICD-10-CM | POA: Diagnosis not present

## 2021-03-11 DIAGNOSIS — M25611 Stiffness of right shoulder, not elsewhere classified: Secondary | ICD-10-CM | POA: Diagnosis not present

## 2021-03-11 DIAGNOSIS — Z4789 Encounter for other orthopedic aftercare: Secondary | ICD-10-CM | POA: Diagnosis not present

## 2021-03-11 DIAGNOSIS — R293 Abnormal posture: Secondary | ICD-10-CM | POA: Diagnosis not present

## 2021-03-12 DIAGNOSIS — Z4789 Encounter for other orthopedic aftercare: Secondary | ICD-10-CM | POA: Diagnosis not present

## 2021-03-12 DIAGNOSIS — M25511 Pain in right shoulder: Secondary | ICD-10-CM | POA: Diagnosis not present

## 2021-03-12 DIAGNOSIS — R293 Abnormal posture: Secondary | ICD-10-CM | POA: Diagnosis not present

## 2021-03-12 DIAGNOSIS — M25611 Stiffness of right shoulder, not elsewhere classified: Secondary | ICD-10-CM | POA: Diagnosis not present

## 2021-03-12 DIAGNOSIS — R531 Weakness: Secondary | ICD-10-CM | POA: Diagnosis not present

## 2021-03-13 ENCOUNTER — Telehealth: Payer: Self-pay | Admitting: *Deleted

## 2021-03-13 NOTE — Telephone Encounter (Signed)
Pt called to office stating she may have UTI, ask for tx TC back to pt for more info. No answer, LVM to contact office and holiday hours given.

## 2021-03-17 DIAGNOSIS — R531 Weakness: Secondary | ICD-10-CM | POA: Diagnosis not present

## 2021-03-17 DIAGNOSIS — R293 Abnormal posture: Secondary | ICD-10-CM | POA: Diagnosis not present

## 2021-03-17 DIAGNOSIS — M25511 Pain in right shoulder: Secondary | ICD-10-CM | POA: Diagnosis not present

## 2021-03-17 DIAGNOSIS — M25611 Stiffness of right shoulder, not elsewhere classified: Secondary | ICD-10-CM | POA: Diagnosis not present

## 2021-03-17 DIAGNOSIS — Z4789 Encounter for other orthopedic aftercare: Secondary | ICD-10-CM | POA: Diagnosis not present

## 2021-03-19 DIAGNOSIS — R293 Abnormal posture: Secondary | ICD-10-CM | POA: Diagnosis not present

## 2021-03-19 DIAGNOSIS — Z4789 Encounter for other orthopedic aftercare: Secondary | ICD-10-CM | POA: Diagnosis not present

## 2021-03-19 DIAGNOSIS — M25511 Pain in right shoulder: Secondary | ICD-10-CM | POA: Diagnosis not present

## 2021-03-19 DIAGNOSIS — M25611 Stiffness of right shoulder, not elsewhere classified: Secondary | ICD-10-CM | POA: Diagnosis not present

## 2021-03-19 DIAGNOSIS — R531 Weakness: Secondary | ICD-10-CM | POA: Diagnosis not present

## 2021-03-20 ENCOUNTER — Other Ambulatory Visit: Payer: Self-pay | Admitting: Obstetrics

## 2021-03-20 DIAGNOSIS — N390 Urinary tract infection, site not specified: Secondary | ICD-10-CM

## 2021-03-20 MED ORDER — NITROFURANTOIN MONOHYD MACRO 100 MG PO CAPS: 100.0000 mg | ORAL_CAPSULE | Freq: Two times a day (BID) | ORAL | 2 refills | Status: DC

## 2021-03-25 DIAGNOSIS — R293 Abnormal posture: Secondary | ICD-10-CM | POA: Diagnosis not present

## 2021-03-25 DIAGNOSIS — Z4789 Encounter for other orthopedic aftercare: Secondary | ICD-10-CM | POA: Diagnosis not present

## 2021-03-25 DIAGNOSIS — M25511 Pain in right shoulder: Secondary | ICD-10-CM | POA: Diagnosis not present

## 2021-03-25 DIAGNOSIS — R531 Weakness: Secondary | ICD-10-CM | POA: Diagnosis not present

## 2021-03-25 DIAGNOSIS — M25611 Stiffness of right shoulder, not elsewhere classified: Secondary | ICD-10-CM | POA: Diagnosis not present

## 2021-03-26 DIAGNOSIS — M25611 Stiffness of right shoulder, not elsewhere classified: Secondary | ICD-10-CM | POA: Diagnosis not present

## 2021-03-26 DIAGNOSIS — R531 Weakness: Secondary | ICD-10-CM | POA: Diagnosis not present

## 2021-03-26 DIAGNOSIS — M25511 Pain in right shoulder: Secondary | ICD-10-CM | POA: Diagnosis not present

## 2021-03-26 DIAGNOSIS — Z4789 Encounter for other orthopedic aftercare: Secondary | ICD-10-CM | POA: Diagnosis not present

## 2021-03-26 DIAGNOSIS — R293 Abnormal posture: Secondary | ICD-10-CM | POA: Diagnosis not present

## 2021-03-31 DIAGNOSIS — N390 Urinary tract infection, site not specified: Secondary | ICD-10-CM | POA: Diagnosis not present

## 2021-03-31 DIAGNOSIS — Z8249 Family history of ischemic heart disease and other diseases of the circulatory system: Secondary | ICD-10-CM | POA: Diagnosis not present

## 2021-03-31 DIAGNOSIS — Z833 Family history of diabetes mellitus: Secondary | ICD-10-CM | POA: Diagnosis not present

## 2021-03-31 DIAGNOSIS — K219 Gastro-esophageal reflux disease without esophagitis: Secondary | ICD-10-CM | POA: Diagnosis not present

## 2021-03-31 DIAGNOSIS — M1990 Unspecified osteoarthritis, unspecified site: Secondary | ICD-10-CM | POA: Diagnosis not present

## 2021-03-31 DIAGNOSIS — R030 Elevated blood-pressure reading, without diagnosis of hypertension: Secondary | ICD-10-CM | POA: Diagnosis not present

## 2021-03-31 DIAGNOSIS — I499 Cardiac arrhythmia, unspecified: Secondary | ICD-10-CM | POA: Diagnosis not present

## 2021-03-31 DIAGNOSIS — Z87891 Personal history of nicotine dependence: Secondary | ICD-10-CM | POA: Diagnosis not present

## 2021-03-31 DIAGNOSIS — Z820 Family history of epilepsy and other diseases of the nervous system: Secondary | ICD-10-CM | POA: Diagnosis not present

## 2021-04-01 DIAGNOSIS — M25611 Stiffness of right shoulder, not elsewhere classified: Secondary | ICD-10-CM | POA: Diagnosis not present

## 2021-04-01 DIAGNOSIS — R293 Abnormal posture: Secondary | ICD-10-CM | POA: Diagnosis not present

## 2021-04-01 DIAGNOSIS — R531 Weakness: Secondary | ICD-10-CM | POA: Diagnosis not present

## 2021-04-01 DIAGNOSIS — Z4789 Encounter for other orthopedic aftercare: Secondary | ICD-10-CM | POA: Diagnosis not present

## 2021-04-01 DIAGNOSIS — M25511 Pain in right shoulder: Secondary | ICD-10-CM | POA: Diagnosis not present

## 2021-04-03 DIAGNOSIS — M25511 Pain in right shoulder: Secondary | ICD-10-CM | POA: Diagnosis not present

## 2021-04-03 DIAGNOSIS — R293 Abnormal posture: Secondary | ICD-10-CM | POA: Diagnosis not present

## 2021-04-03 DIAGNOSIS — R531 Weakness: Secondary | ICD-10-CM | POA: Diagnosis not present

## 2021-04-03 DIAGNOSIS — Z4789 Encounter for other orthopedic aftercare: Secondary | ICD-10-CM | POA: Diagnosis not present

## 2021-04-03 DIAGNOSIS — M25611 Stiffness of right shoulder, not elsewhere classified: Secondary | ICD-10-CM | POA: Diagnosis not present

## 2021-04-06 DIAGNOSIS — M25511 Pain in right shoulder: Secondary | ICD-10-CM | POA: Diagnosis not present

## 2021-04-06 DIAGNOSIS — Z4789 Encounter for other orthopedic aftercare: Secondary | ICD-10-CM | POA: Diagnosis not present

## 2021-04-06 DIAGNOSIS — R531 Weakness: Secondary | ICD-10-CM | POA: Diagnosis not present

## 2021-04-06 DIAGNOSIS — R293 Abnormal posture: Secondary | ICD-10-CM | POA: Diagnosis not present

## 2021-04-06 DIAGNOSIS — M25611 Stiffness of right shoulder, not elsewhere classified: Secondary | ICD-10-CM | POA: Diagnosis not present

## 2021-04-09 ENCOUNTER — Other Ambulatory Visit (HOSPITAL_COMMUNITY): Payer: Self-pay | Admitting: Obstetrics

## 2021-04-09 DIAGNOSIS — Z1231 Encounter for screening mammogram for malignant neoplasm of breast: Secondary | ICD-10-CM

## 2021-04-10 DIAGNOSIS — M25511 Pain in right shoulder: Secondary | ICD-10-CM | POA: Diagnosis not present

## 2021-04-10 DIAGNOSIS — R293 Abnormal posture: Secondary | ICD-10-CM | POA: Diagnosis not present

## 2021-04-10 DIAGNOSIS — M25611 Stiffness of right shoulder, not elsewhere classified: Secondary | ICD-10-CM | POA: Diagnosis not present

## 2021-04-10 DIAGNOSIS — R531 Weakness: Secondary | ICD-10-CM | POA: Diagnosis not present

## 2021-04-10 DIAGNOSIS — Z4789 Encounter for other orthopedic aftercare: Secondary | ICD-10-CM | POA: Diagnosis not present

## 2021-04-13 ENCOUNTER — Ambulatory Visit (HOSPITAL_COMMUNITY)
Admission: RE | Admit: 2021-04-13 | Discharge: 2021-04-13 | Disposition: A | Discharge status: Home / Self Care | Payer: Medicare PPO | Source: Ambulatory Visit | Attending: Obstetrics | Admitting: Obstetrics

## 2021-04-13 ENCOUNTER — Other Ambulatory Visit: Payer: Self-pay

## 2021-04-13 DIAGNOSIS — Z4789 Encounter for other orthopedic aftercare: Secondary | ICD-10-CM | POA: Diagnosis not present

## 2021-04-13 DIAGNOSIS — Z1231 Encounter for screening mammogram for malignant neoplasm of breast: Secondary | ICD-10-CM | POA: Diagnosis not present

## 2021-04-13 DIAGNOSIS — M25611 Stiffness of right shoulder, not elsewhere classified: Secondary | ICD-10-CM | POA: Diagnosis not present

## 2021-04-13 DIAGNOSIS — R531 Weakness: Secondary | ICD-10-CM | POA: Diagnosis not present

## 2021-04-13 DIAGNOSIS — R293 Abnormal posture: Secondary | ICD-10-CM | POA: Diagnosis not present

## 2021-04-13 DIAGNOSIS — M25511 Pain in right shoulder: Secondary | ICD-10-CM | POA: Diagnosis not present

## 2021-04-17 DIAGNOSIS — Z4789 Encounter for other orthopedic aftercare: Secondary | ICD-10-CM | POA: Diagnosis not present

## 2021-04-17 DIAGNOSIS — R293 Abnormal posture: Secondary | ICD-10-CM | POA: Diagnosis not present

## 2021-04-17 DIAGNOSIS — R531 Weakness: Secondary | ICD-10-CM | POA: Diagnosis not present

## 2021-04-17 DIAGNOSIS — M25611 Stiffness of right shoulder, not elsewhere classified: Secondary | ICD-10-CM | POA: Diagnosis not present

## 2021-04-17 DIAGNOSIS — M25511 Pain in right shoulder: Secondary | ICD-10-CM | POA: Diagnosis not present

## 2021-04-20 DIAGNOSIS — R293 Abnormal posture: Secondary | ICD-10-CM | POA: Diagnosis not present

## 2021-04-20 DIAGNOSIS — R531 Weakness: Secondary | ICD-10-CM | POA: Diagnosis not present

## 2021-04-20 DIAGNOSIS — M25511 Pain in right shoulder: Secondary | ICD-10-CM | POA: Diagnosis not present

## 2021-04-20 DIAGNOSIS — Z4789 Encounter for other orthopedic aftercare: Secondary | ICD-10-CM | POA: Diagnosis not present

## 2021-04-20 DIAGNOSIS — M25611 Stiffness of right shoulder, not elsewhere classified: Secondary | ICD-10-CM | POA: Diagnosis not present

## 2021-04-22 DIAGNOSIS — R531 Weakness: Secondary | ICD-10-CM | POA: Diagnosis not present

## 2021-04-22 DIAGNOSIS — Z4789 Encounter for other orthopedic aftercare: Secondary | ICD-10-CM | POA: Diagnosis not present

## 2021-04-22 DIAGNOSIS — M25511 Pain in right shoulder: Secondary | ICD-10-CM | POA: Diagnosis not present

## 2021-04-22 DIAGNOSIS — M25611 Stiffness of right shoulder, not elsewhere classified: Secondary | ICD-10-CM | POA: Diagnosis not present

## 2021-04-22 DIAGNOSIS — R293 Abnormal posture: Secondary | ICD-10-CM | POA: Diagnosis not present

## 2021-04-27 DIAGNOSIS — M25511 Pain in right shoulder: Secondary | ICD-10-CM | POA: Diagnosis not present

## 2021-04-27 DIAGNOSIS — M25611 Stiffness of right shoulder, not elsewhere classified: Secondary | ICD-10-CM | POA: Diagnosis not present

## 2021-04-27 DIAGNOSIS — R293 Abnormal posture: Secondary | ICD-10-CM | POA: Diagnosis not present

## 2021-04-27 DIAGNOSIS — R531 Weakness: Secondary | ICD-10-CM | POA: Diagnosis not present

## 2021-04-27 DIAGNOSIS — Z4789 Encounter for other orthopedic aftercare: Secondary | ICD-10-CM | POA: Diagnosis not present

## 2021-04-29 DIAGNOSIS — R293 Abnormal posture: Secondary | ICD-10-CM | POA: Diagnosis not present

## 2021-04-29 DIAGNOSIS — M25611 Stiffness of right shoulder, not elsewhere classified: Secondary | ICD-10-CM | POA: Diagnosis not present

## 2021-04-29 DIAGNOSIS — R531 Weakness: Secondary | ICD-10-CM | POA: Diagnosis not present

## 2021-04-29 DIAGNOSIS — M25511 Pain in right shoulder: Secondary | ICD-10-CM | POA: Diagnosis not present

## 2021-04-29 DIAGNOSIS — Z4789 Encounter for other orthopedic aftercare: Secondary | ICD-10-CM | POA: Diagnosis not present

## 2021-05-04 DIAGNOSIS — R531 Weakness: Secondary | ICD-10-CM | POA: Diagnosis not present

## 2021-05-04 DIAGNOSIS — M25611 Stiffness of right shoulder, not elsewhere classified: Secondary | ICD-10-CM | POA: Diagnosis not present

## 2021-05-04 DIAGNOSIS — R293 Abnormal posture: Secondary | ICD-10-CM | POA: Diagnosis not present

## 2021-05-04 DIAGNOSIS — M25511 Pain in right shoulder: Secondary | ICD-10-CM | POA: Diagnosis not present

## 2021-05-04 DIAGNOSIS — Z4789 Encounter for other orthopedic aftercare: Secondary | ICD-10-CM | POA: Diagnosis not present

## 2021-05-06 DIAGNOSIS — M25611 Stiffness of right shoulder, not elsewhere classified: Secondary | ICD-10-CM | POA: Diagnosis not present

## 2021-05-06 DIAGNOSIS — R293 Abnormal posture: Secondary | ICD-10-CM | POA: Diagnosis not present

## 2021-05-06 DIAGNOSIS — R531 Weakness: Secondary | ICD-10-CM | POA: Diagnosis not present

## 2021-05-06 DIAGNOSIS — Z4789 Encounter for other orthopedic aftercare: Secondary | ICD-10-CM | POA: Diagnosis not present

## 2021-05-06 DIAGNOSIS — M25511 Pain in right shoulder: Secondary | ICD-10-CM | POA: Diagnosis not present

## 2021-05-20 DIAGNOSIS — R531 Weakness: Secondary | ICD-10-CM | POA: Diagnosis not present

## 2021-05-20 DIAGNOSIS — M25611 Stiffness of right shoulder, not elsewhere classified: Secondary | ICD-10-CM | POA: Diagnosis not present

## 2021-05-20 DIAGNOSIS — Z4789 Encounter for other orthopedic aftercare: Secondary | ICD-10-CM | POA: Diagnosis not present

## 2021-05-20 DIAGNOSIS — M25511 Pain in right shoulder: Secondary | ICD-10-CM | POA: Diagnosis not present

## 2021-05-20 DIAGNOSIS — R293 Abnormal posture: Secondary | ICD-10-CM | POA: Diagnosis not present

## 2021-05-22 DIAGNOSIS — M25511 Pain in right shoulder: Secondary | ICD-10-CM | POA: Diagnosis not present

## 2021-05-22 DIAGNOSIS — R293 Abnormal posture: Secondary | ICD-10-CM | POA: Diagnosis not present

## 2021-05-22 DIAGNOSIS — Z4789 Encounter for other orthopedic aftercare: Secondary | ICD-10-CM | POA: Diagnosis not present

## 2021-05-22 DIAGNOSIS — R531 Weakness: Secondary | ICD-10-CM | POA: Diagnosis not present

## 2021-05-22 DIAGNOSIS — M25611 Stiffness of right shoulder, not elsewhere classified: Secondary | ICD-10-CM | POA: Diagnosis not present

## 2021-05-26 DIAGNOSIS — M25611 Stiffness of right shoulder, not elsewhere classified: Secondary | ICD-10-CM | POA: Diagnosis not present

## 2021-05-26 DIAGNOSIS — R531 Weakness: Secondary | ICD-10-CM | POA: Diagnosis not present

## 2021-05-26 DIAGNOSIS — R293 Abnormal posture: Secondary | ICD-10-CM | POA: Diagnosis not present

## 2021-05-26 DIAGNOSIS — Z4789 Encounter for other orthopedic aftercare: Secondary | ICD-10-CM | POA: Diagnosis not present

## 2021-05-26 DIAGNOSIS — M25511 Pain in right shoulder: Secondary | ICD-10-CM | POA: Diagnosis not present

## 2021-05-28 DIAGNOSIS — M25511 Pain in right shoulder: Secondary | ICD-10-CM | POA: Diagnosis not present

## 2021-05-28 DIAGNOSIS — R293 Abnormal posture: Secondary | ICD-10-CM | POA: Diagnosis not present

## 2021-05-28 DIAGNOSIS — Z4789 Encounter for other orthopedic aftercare: Secondary | ICD-10-CM | POA: Diagnosis not present

## 2021-05-28 DIAGNOSIS — R531 Weakness: Secondary | ICD-10-CM | POA: Diagnosis not present

## 2021-05-28 DIAGNOSIS — M25611 Stiffness of right shoulder, not elsewhere classified: Secondary | ICD-10-CM | POA: Diagnosis not present

## 2021-05-29 DIAGNOSIS — Z4789 Encounter for other orthopedic aftercare: Secondary | ICD-10-CM | POA: Diagnosis not present

## 2021-05-29 DIAGNOSIS — R293 Abnormal posture: Secondary | ICD-10-CM | POA: Diagnosis not present

## 2021-05-29 DIAGNOSIS — M25611 Stiffness of right shoulder, not elsewhere classified: Secondary | ICD-10-CM | POA: Diagnosis not present

## 2021-05-29 DIAGNOSIS — R531 Weakness: Secondary | ICD-10-CM | POA: Diagnosis not present

## 2021-05-29 DIAGNOSIS — M25511 Pain in right shoulder: Secondary | ICD-10-CM | POA: Diagnosis not present

## 2021-06-01 DIAGNOSIS — Z4789 Encounter for other orthopedic aftercare: Secondary | ICD-10-CM | POA: Diagnosis not present

## 2021-06-01 DIAGNOSIS — R293 Abnormal posture: Secondary | ICD-10-CM | POA: Diagnosis not present

## 2021-06-01 DIAGNOSIS — R531 Weakness: Secondary | ICD-10-CM | POA: Diagnosis not present

## 2021-06-01 DIAGNOSIS — M25611 Stiffness of right shoulder, not elsewhere classified: Secondary | ICD-10-CM | POA: Diagnosis not present

## 2021-06-01 DIAGNOSIS — M25511 Pain in right shoulder: Secondary | ICD-10-CM | POA: Diagnosis not present

## 2021-06-02 DIAGNOSIS — M25611 Stiffness of right shoulder, not elsewhere classified: Secondary | ICD-10-CM | POA: Diagnosis not present

## 2021-06-02 DIAGNOSIS — R293 Abnormal posture: Secondary | ICD-10-CM | POA: Diagnosis not present

## 2021-06-02 DIAGNOSIS — Z4789 Encounter for other orthopedic aftercare: Secondary | ICD-10-CM | POA: Diagnosis not present

## 2021-06-02 DIAGNOSIS — R531 Weakness: Secondary | ICD-10-CM | POA: Diagnosis not present

## 2021-06-02 DIAGNOSIS — M25511 Pain in right shoulder: Secondary | ICD-10-CM | POA: Diagnosis not present

## 2021-06-04 DIAGNOSIS — R531 Weakness: Secondary | ICD-10-CM | POA: Diagnosis not present

## 2021-06-04 DIAGNOSIS — Z4789 Encounter for other orthopedic aftercare: Secondary | ICD-10-CM | POA: Diagnosis not present

## 2021-06-04 DIAGNOSIS — R293 Abnormal posture: Secondary | ICD-10-CM | POA: Diagnosis not present

## 2021-06-04 DIAGNOSIS — M25511 Pain in right shoulder: Secondary | ICD-10-CM | POA: Diagnosis not present

## 2021-06-04 DIAGNOSIS — M25611 Stiffness of right shoulder, not elsewhere classified: Secondary | ICD-10-CM | POA: Diagnosis not present

## 2021-06-08 DIAGNOSIS — R293 Abnormal posture: Secondary | ICD-10-CM | POA: Diagnosis not present

## 2021-06-08 DIAGNOSIS — M25511 Pain in right shoulder: Secondary | ICD-10-CM | POA: Diagnosis not present

## 2021-06-08 DIAGNOSIS — Z4789 Encounter for other orthopedic aftercare: Secondary | ICD-10-CM | POA: Diagnosis not present

## 2021-06-08 DIAGNOSIS — M25611 Stiffness of right shoulder, not elsewhere classified: Secondary | ICD-10-CM | POA: Diagnosis not present

## 2021-06-08 DIAGNOSIS — R531 Weakness: Secondary | ICD-10-CM | POA: Diagnosis not present

## 2021-06-10 DIAGNOSIS — J0110 Acute frontal sinusitis, unspecified: Secondary | ICD-10-CM | POA: Diagnosis not present

## 2021-06-10 DIAGNOSIS — J302 Other seasonal allergic rhinitis: Secondary | ICD-10-CM | POA: Diagnosis not present

## 2021-06-10 DIAGNOSIS — Z6824 Body mass index (BMI) 24.0-24.9, adult: Secondary | ICD-10-CM | POA: Diagnosis not present

## 2021-06-16 DIAGNOSIS — R531 Weakness: Secondary | ICD-10-CM | POA: Diagnosis not present

## 2021-06-16 DIAGNOSIS — Z4789 Encounter for other orthopedic aftercare: Secondary | ICD-10-CM | POA: Diagnosis not present

## 2021-06-16 DIAGNOSIS — M25611 Stiffness of right shoulder, not elsewhere classified: Secondary | ICD-10-CM | POA: Diagnosis not present

## 2021-06-16 DIAGNOSIS — R293 Abnormal posture: Secondary | ICD-10-CM | POA: Diagnosis not present

## 2021-06-16 DIAGNOSIS — M25511 Pain in right shoulder: Secondary | ICD-10-CM | POA: Diagnosis not present

## 2021-06-19 DIAGNOSIS — R293 Abnormal posture: Secondary | ICD-10-CM | POA: Diagnosis not present

## 2021-06-19 DIAGNOSIS — M25511 Pain in right shoulder: Secondary | ICD-10-CM | POA: Diagnosis not present

## 2021-06-19 DIAGNOSIS — M25611 Stiffness of right shoulder, not elsewhere classified: Secondary | ICD-10-CM | POA: Diagnosis not present

## 2021-06-19 DIAGNOSIS — R531 Weakness: Secondary | ICD-10-CM | POA: Diagnosis not present

## 2021-06-19 DIAGNOSIS — Z4789 Encounter for other orthopedic aftercare: Secondary | ICD-10-CM | POA: Diagnosis not present

## 2021-06-22 DIAGNOSIS — M25511 Pain in right shoulder: Secondary | ICD-10-CM | POA: Diagnosis not present

## 2021-06-22 DIAGNOSIS — Z4789 Encounter for other orthopedic aftercare: Secondary | ICD-10-CM | POA: Diagnosis not present

## 2021-06-23 DIAGNOSIS — M25511 Pain in right shoulder: Secondary | ICD-10-CM | POA: Diagnosis not present

## 2021-06-23 DIAGNOSIS — R531 Weakness: Secondary | ICD-10-CM | POA: Diagnosis not present

## 2021-06-23 DIAGNOSIS — R293 Abnormal posture: Secondary | ICD-10-CM | POA: Diagnosis not present

## 2021-06-23 DIAGNOSIS — Z4789 Encounter for other orthopedic aftercare: Secondary | ICD-10-CM | POA: Diagnosis not present

## 2021-06-23 DIAGNOSIS — M25611 Stiffness of right shoulder, not elsewhere classified: Secondary | ICD-10-CM | POA: Diagnosis not present

## 2021-06-26 DIAGNOSIS — R293 Abnormal posture: Secondary | ICD-10-CM | POA: Diagnosis not present

## 2021-06-26 DIAGNOSIS — Z4789 Encounter for other orthopedic aftercare: Secondary | ICD-10-CM | POA: Diagnosis not present

## 2021-06-26 DIAGNOSIS — M25611 Stiffness of right shoulder, not elsewhere classified: Secondary | ICD-10-CM | POA: Diagnosis not present

## 2021-06-26 DIAGNOSIS — M25511 Pain in right shoulder: Secondary | ICD-10-CM | POA: Diagnosis not present

## 2021-06-26 DIAGNOSIS — R531 Weakness: Secondary | ICD-10-CM | POA: Diagnosis not present

## 2021-06-30 DIAGNOSIS — R531 Weakness: Secondary | ICD-10-CM | POA: Diagnosis not present

## 2021-06-30 DIAGNOSIS — R293 Abnormal posture: Secondary | ICD-10-CM | POA: Diagnosis not present

## 2021-06-30 DIAGNOSIS — M25511 Pain in right shoulder: Secondary | ICD-10-CM | POA: Diagnosis not present

## 2021-06-30 DIAGNOSIS — Z4789 Encounter for other orthopedic aftercare: Secondary | ICD-10-CM | POA: Diagnosis not present

## 2021-06-30 DIAGNOSIS — M25611 Stiffness of right shoulder, not elsewhere classified: Secondary | ICD-10-CM | POA: Diagnosis not present

## 2021-07-07 DIAGNOSIS — M25511 Pain in right shoulder: Secondary | ICD-10-CM | POA: Diagnosis not present

## 2021-07-07 DIAGNOSIS — Z4789 Encounter for other orthopedic aftercare: Secondary | ICD-10-CM | POA: Diagnosis not present

## 2021-07-07 DIAGNOSIS — R293 Abnormal posture: Secondary | ICD-10-CM | POA: Diagnosis not present

## 2021-07-07 DIAGNOSIS — M25611 Stiffness of right shoulder, not elsewhere classified: Secondary | ICD-10-CM | POA: Diagnosis not present

## 2021-07-07 DIAGNOSIS — R531 Weakness: Secondary | ICD-10-CM | POA: Diagnosis not present

## 2021-07-10 DIAGNOSIS — M25611 Stiffness of right shoulder, not elsewhere classified: Secondary | ICD-10-CM | POA: Diagnosis not present

## 2021-07-10 DIAGNOSIS — M25511 Pain in right shoulder: Secondary | ICD-10-CM | POA: Diagnosis not present

## 2021-07-10 DIAGNOSIS — R531 Weakness: Secondary | ICD-10-CM | POA: Diagnosis not present

## 2021-07-10 DIAGNOSIS — Z4789 Encounter for other orthopedic aftercare: Secondary | ICD-10-CM | POA: Diagnosis not present

## 2021-07-10 DIAGNOSIS — R293 Abnormal posture: Secondary | ICD-10-CM | POA: Diagnosis not present

## 2021-07-13 DIAGNOSIS — M25611 Stiffness of right shoulder, not elsewhere classified: Secondary | ICD-10-CM | POA: Diagnosis not present

## 2021-07-13 DIAGNOSIS — Z4789 Encounter for other orthopedic aftercare: Secondary | ICD-10-CM | POA: Diagnosis not present

## 2021-07-13 DIAGNOSIS — R293 Abnormal posture: Secondary | ICD-10-CM | POA: Diagnosis not present

## 2021-07-13 DIAGNOSIS — M25511 Pain in right shoulder: Secondary | ICD-10-CM | POA: Diagnosis not present

## 2021-07-13 DIAGNOSIS — R531 Weakness: Secondary | ICD-10-CM | POA: Diagnosis not present

## 2021-07-15 DIAGNOSIS — Z4789 Encounter for other orthopedic aftercare: Secondary | ICD-10-CM | POA: Diagnosis not present

## 2021-07-15 DIAGNOSIS — R293 Abnormal posture: Secondary | ICD-10-CM | POA: Diagnosis not present

## 2021-07-15 DIAGNOSIS — R531 Weakness: Secondary | ICD-10-CM | POA: Diagnosis not present

## 2021-07-15 DIAGNOSIS — M25511 Pain in right shoulder: Secondary | ICD-10-CM | POA: Diagnosis not present

## 2021-07-15 DIAGNOSIS — M25611 Stiffness of right shoulder, not elsewhere classified: Secondary | ICD-10-CM | POA: Diagnosis not present

## 2021-08-03 DIAGNOSIS — M25511 Pain in right shoulder: Secondary | ICD-10-CM | POA: Diagnosis not present

## 2021-08-03 DIAGNOSIS — Z4789 Encounter for other orthopedic aftercare: Secondary | ICD-10-CM | POA: Diagnosis not present

## 2021-08-13 DIAGNOSIS — M25511 Pain in right shoulder: Secondary | ICD-10-CM | POA: Diagnosis not present

## 2021-08-24 DIAGNOSIS — M75121 Complete rotator cuff tear or rupture of right shoulder, not specified as traumatic: Secondary | ICD-10-CM | POA: Diagnosis not present

## 2021-09-01 ENCOUNTER — Encounter (HOSPITAL_COMMUNITY): Payer: Self-pay | Admitting: Emergency Medicine

## 2021-09-01 ENCOUNTER — Emergency Department (HOSPITAL_COMMUNITY)
Admission: EM | Admit: 2021-09-01 | Discharge: 2021-09-01 | Disposition: A | Discharge status: Home / Self Care | Payer: Medicare PPO | Attending: Emergency Medicine | Admitting: Emergency Medicine

## 2021-09-01 ENCOUNTER — Emergency Department (HOSPITAL_COMMUNITY): Payer: Medicare PPO

## 2021-09-01 ENCOUNTER — Other Ambulatory Visit: Payer: Self-pay

## 2021-09-01 DIAGNOSIS — R079 Chest pain, unspecified: Secondary | ICD-10-CM | POA: Diagnosis not present

## 2021-09-01 DIAGNOSIS — I700 Atherosclerosis of aorta: Secondary | ICD-10-CM | POA: Diagnosis not present

## 2021-09-01 DIAGNOSIS — R0789 Other chest pain: Secondary | ICD-10-CM | POA: Diagnosis not present

## 2021-09-01 DIAGNOSIS — M940 Chondrocostal junction syndrome [Tietze]: Secondary | ICD-10-CM | POA: Diagnosis not present

## 2021-09-01 DIAGNOSIS — Z6825 Body mass index (BMI) 25.0-25.9, adult: Secondary | ICD-10-CM | POA: Diagnosis not present

## 2021-09-01 DIAGNOSIS — I301 Infective pericarditis: Secondary | ICD-10-CM | POA: Diagnosis not present

## 2021-09-01 LAB — BASIC METABOLIC PANEL
Anion gap: 5 (ref 5–15)
BUN: 9 mg/dL (ref 8–23)
CO2: 28 mmol/L (ref 22–32)
Calcium: 9.2 mg/dL (ref 8.9–10.3)
Chloride: 103 mmol/L (ref 98–111)
Creatinine, Ser: 0.78 mg/dL (ref 0.44–1.00)
GFR, Estimated: >60 mL/min (ref >60)
Glucose, Bld: 92 mg/dL (ref 70–99)
Potassium: 4.2 mmol/L (ref 3.5–5.1)
Sodium: 136 mmol/L (ref 135–145)

## 2021-09-01 LAB — CBC
HCT: 40.3 % (ref 36.0–46.0)
Hemoglobin: 12.7 g/dL (ref 12.0–15.0)
MCH: 29.6 pg (ref 26.0–34.0)
MCHC: 31.5 g/dL (ref 30.0–36.0)
MCV: 93.9 fL (ref 80.0–100.0)
Platelets: 279 10*3/uL (ref 150–400)
RBC: 4.29 MIL/uL (ref 3.87–5.11)
RDW: 14.2 % (ref 11.5–15.5)
WBC: 5.9 10*3/uL (ref 4.0–10.5)
nRBC: 0 % (ref 0.0–0.2)

## 2021-09-01 LAB — TROPONIN I (HIGH SENSITIVITY)
Troponin I (High Sensitivity): 2 ng/L (ref <18)
Troponin I (High Sensitivity): 3 ng/L (ref <18)

## 2021-09-01 LAB — D-DIMER, QUANTITATIVE: D-Dimer, Quant: 0.3 ug/mL-FEU (ref 0.00–0.50)

## 2021-09-01 MED ORDER — NAPROXEN 375 MG PO TABS: 375.0000 mg | ORAL_TABLET | Freq: Two times a day (BID) | ORAL | 0 refills | Status: DC

## 2021-09-01 NOTE — Discharge Instructions (Signed)
Your work-up today was reassuring.  Your pain may be related to muscle strain.  Please take the medication as directed until its finished.  Please take with food.  You may apply warm heat on and off to your chest wall.  Follow-up with your primary care provider for recheck.  Return to emergency department for any new or worsening symptoms.

## 2021-09-01 NOTE — ED Triage Notes (Signed)
Pt sent over from Dr. Hilma Favors office for evaluation of left chest soreness, per pt pain has been going on for 2 weeks, given Nitro at PCP office, pain rated 5/10.

## 2021-09-02 NOTE — ED Provider Notes (Signed)
[facility] Provider Note   CSN: [id number] Arrival date & time: 09/01/21  1243     History  Chief Complaint  Patient presents with   Chest Pain    Nicole Guerrero is a 71 y.o. female.   Chest Pain Associated symptoms: no abdominal pain, no cough, no dizziness, no fever, no headache, no nausea, no numbness, no palpitations, no shortness of breath, no vomiting and no weakness         Nicole Guerrero is a 71 y.o. female with history of GERD and OA of the shoulder, presents to the Emergency Department complaining of upper chest pain x 2 weeks.  She describes her chest symptoms as "soreness" and reproduced with certain movement.  She was seen by PCP and sent to ER for further evaluation.  She was given sublingual NTG in PCP office without significant change in her symptoms.  She denies shortness of breath, palpitations, neck pain, pain numbness or weakness radiating into her arms, hands or back.  No cough, fever or chills. Pain improves at rest.    Home Medications Prior to Admission medications   Medication Sig Start Date End Date Taking? Authorizing Provider  naproxen (NAPROSYN) 375 MG tablet Take 1 tablet (375 mg total) by mouth 2 (two) times daily with a meal. 09/01/21  Yes Mikeria Valin, PA-C  calcium carbonate (TUMS - DOSED IN MG ELEMENTAL CALCIUM) 500 MG chewable tablet Chew 1 tablet by mouth daily as needed for indigestion or heartburn.    [provider]  Cholecalciferol (VITAMIN D) 2000 UNITS CAPS Take 2,000 Units by mouth every morning.    [provider]  Multiple Vitamin (MULTIVITAMIN WITH MINERALS) TABS Take 1 tablet by mouth every other day.     [provider]      Allergies    Patient has no known allergies.    Review of Systems   Review of Systems  Constitutional:  Negative for appetite change, chills and fever.  Respiratory:  Negative for cough, chest tightness and shortness of breath.   Cardiovascular:  Positive  for chest pain. Negative for palpitations and leg swelling.  Gastrointestinal:  Negative for abdominal pain, nausea and vomiting.  Musculoskeletal:  Negative for neck pain.  Skin:  Negative for rash.  Neurological:  Negative for dizziness, weakness, numbness and headaches.    Physical Exam Updated Vital Signs BP (!) 166/68   Pulse 62   Temp 97.8 F (36.6 C) (Oral)   Resp 12   Ht '5\' 2"'$  (1.575 m)   Wt 60.8 kg   SpO2 99%   BMI 24.51 kg/m  Physical Exam Vitals and nursing note reviewed.  Constitutional:      General: She is not in acute distress.    Appearance: Normal appearance. She is well-developed. She is not ill-appearing or toxic-appearing.  Neck:     Vascular: No carotid bruit.  Cardiovascular:     Rate and Rhythm: Normal rate and regular rhythm.     Pulses: Normal pulses.  Pulmonary:     Effort: Pulmonary effort is normal.     Breath sounds: Normal breath sounds.  Chest:     Chest wall: Tenderness (ttp of the upper mid and left chest wall.  no bony deformity or crepitus) present.  Abdominal:     Palpations: Abdomen is soft.     Tenderness: There is no abdominal tenderness.  Musculoskeletal:        General: Normal range of motion.  Cervical back: Normal range of motion. Tenderness (mild ttp of the right cervical paraspinal muscles.) present. No rigidity.     Right lower leg: No edema.     Left lower leg: No edema.  Skin:    General: Skin is warm.     Capillary Refill: Capillary refill takes less than 2 seconds.     Findings: No rash.  Neurological:     General: No focal deficit present.     Mental Status: She is alert.     Sensory: No sensory deficit.     Motor: No weakness.     ED Results / Procedures / Treatments   Labs (all labs ordered are listed, but only abnormal results are displayed) Labs Reviewed  BASIC METABOLIC PANEL  CBC  D-DIMER, QUANTITATIVE  TROPONIN I (HIGH SENSITIVITY)  TROPONIN I (HIGH SENSITIVITY)    EKG EKG  Interpretation  Date/Time:  Tuesday September 01 2021 13:02:31 EDT Ventricular Rate:  55 PR Interval:  150 QRS Duration: 82 QT Interval:  426 QTC Calculation: 407 R Axis:   62 Text Interpretation: Sinus bradycardia Otherwise normal ECG When compared with ECG of 26-Jul-2020 17:20, Criteria for Septal infarct are no longer Present Nonspecific T wave abnormality no longer evident in Inferior leads Nonspecific T wave abnormality no longer evident in Lateral leads Confirmed by Thayer Jew [id number]) on 09/02/2021 10:58:27 AM  Radiology DG Chest 2 View  Result Date: 09/01/2021 CLINICAL DATA:  Left chest soreness EXAM: CHEST - 2 VIEW COMPARISON:  07/26/2020 FINDINGS: Cardiac and mediastinal contours are within normal limits. Aortic atherosclerosis. No focal pulmonary opacity. No pleural effusion or pneumothorax. No acute osseous abnormality. IMPRESSION: No acute cardiopulmonary process. Electronically Signed   By: Merilyn Baba M.D.   On: 09/01/2021 13:37    Procedures Procedures    Medications Ordered in ED Medications - No data to display  ED Course/ Medical Decision Making/ A&P                           Medical Decision Making Amount and/or Complexity of Data Reviewed Labs: ordered. Radiology: ordered.  Risk Prescription drug management.   Pt here for eval of chest pain that she describes as "soreness" persistant x 2 weeks.  Reproduced with palpation and certain movements.    On exam she is well appearing, smiling and non toxic appearing.  Mildly hypertensive but ortherwise vitals reassuring.  No hypoxia, tachycardia or tachypnea.    Diff dx would include ACS, dissection, PE, PNA and MSK. Will check labs , EKG and CXR  On recheck pt resting comfortably.  Labs reassuring including delta trop and d dimer, kidney function is good.Marland Kitchen  EKG w/o acute ischemic change.  Low clinical suspicion for cardiac or pulm process given reproducable pain, mild sx's and duration of 2 weeks.  I feel this  is likely MSK.  Pt is requesting to leave and will be discharged.  She is agreeable to symptomatic tx and close out pt f/u.  Return precautions discussed.         Final Clinical Impression(s) / ED Diagnoses Final diagnoses:  Chest wall pain    Rx / DC Orders ED Discharge Orders          Ordered    naproxen (NAPROSYN) 375 MG tablet  2 times daily with meals        09/01/21 1627              Tykwon Fera, [place], PA-C  09/02/21 1331    Elnora Morrison, MD 09/02/21 1552

## 2021-10-20 DIAGNOSIS — I301 Infective pericarditis: Secondary | ICD-10-CM | POA: Diagnosis not present

## 2021-10-20 DIAGNOSIS — M810 Age-related osteoporosis without current pathological fracture: Secondary | ICD-10-CM | POA: Diagnosis not present

## 2021-10-20 DIAGNOSIS — E782 Mixed hyperlipidemia: Secondary | ICD-10-CM | POA: Diagnosis not present

## 2021-10-20 DIAGNOSIS — E663 Overweight: Secondary | ICD-10-CM | POA: Diagnosis not present

## 2021-10-20 DIAGNOSIS — Z1331 Encounter for screening for depression: Secondary | ICD-10-CM | POA: Diagnosis not present

## 2021-10-20 DIAGNOSIS — Z0001 Encounter for general adult medical examination with abnormal findings: Secondary | ICD-10-CM | POA: Diagnosis not present

## 2021-10-20 DIAGNOSIS — R7309 Other abnormal glucose: Secondary | ICD-10-CM | POA: Diagnosis not present

## 2021-10-20 DIAGNOSIS — Z6825 Body mass index (BMI) 25.0-25.9, adult: Secondary | ICD-10-CM | POA: Diagnosis not present

## 2021-10-20 DIAGNOSIS — E7849 Other hyperlipidemia: Secondary | ICD-10-CM | POA: Diagnosis not present

## 2021-10-20 DIAGNOSIS — E559 Vitamin D deficiency, unspecified: Secondary | ICD-10-CM | POA: Diagnosis not present

## 2021-11-18 ENCOUNTER — Ambulatory Visit: Payer: Medicare PPO | Attending: Cardiology | Admitting: Cardiology

## 2021-11-18 ENCOUNTER — Encounter: Payer: Self-pay | Admitting: Cardiology

## 2021-11-18 ENCOUNTER — Ambulatory Visit: Payer: Medicare PPO | Attending: Cardiology

## 2021-11-18 VITALS — BP 130/75 | HR 64 | Ht 62.0 in | Wt 138.8 lb

## 2021-11-18 DIAGNOSIS — R0989 Other specified symptoms and signs involving the circulatory and respiratory systems: Secondary | ICD-10-CM | POA: Diagnosis not present

## 2021-11-18 DIAGNOSIS — R002 Palpitations: Secondary | ICD-10-CM

## 2021-11-18 DIAGNOSIS — R0789 Other chest pain: Secondary | ICD-10-CM | POA: Diagnosis not present

## 2021-11-18 NOTE — Patient Instructions (Signed)
Medication Instructions:  Your physician recommends that you continue on your current medications as directed. Please refer to the Current Medication list given to you today.   Labwork: none  Testing/Procedures: Your physician has requested that you have a carotid duplex. This test is an ultrasound of the carotid arteries in your neck. It looks at blood flow through these arteries that supply the brain with blood. Allow one hour for this exam. There are no restrictions or special instructions.   Follow-Up:  Your physician recommends that you schedule a follow-up appointment in: Follow Up Pending  Any Other Special Instructions Will Be Listed Below (If Applicable).  If you need a refill on your cardiac medications before your next appointment, please call your pharmacy.

## 2021-11-18 NOTE — Addendum Note (Signed)
Addended by: Sung Amabile on: 11/18/2021 09:38 AM   Modules accepted: Orders

## 2021-11-18 NOTE — Progress Notes (Signed)
Clinical Summary Nicole Guerrero is a 71 y.o.female seen today as a new consult, referred by Dr Hilma Favors for the following medical problems.  1.Chest pain - ER visit 08/2021 with chest pain - from ER notes reproducible with palpation, worst with movement.  - trop neg x 2, Ddimer neg. EKG SR, no acute ischemic changes. CXR no acute process  - episode upper left chest, soreness. 5/10 in severity. No other associated symptoms. Constant pain for a few days, went to pcp. Worst with movement, worst with palpation - still with discomfort at times with pressing.     2.Palpitations - intermittent over the years, infrequent - lasts just a few seconds. No specific triggers - decaf coffee only, occasoinal sodas, tea every other day, no EtOH       Past Medical History:  Diagnosis Date   GERD (gastroesophageal reflux disease)    Viral endocarditis      No Known Allergies   Current Outpatient Medications  Medication Sig Dispense Refill   calcium carbonate (TUMS - DOSED IN MG ELEMENTAL CALCIUM) 500 MG chewable tablet Chew 1 tablet by mouth daily as needed for indigestion or heartburn.     Cholecalciferol (VITAMIN D) 2000 UNITS CAPS Take 2,000 Units by mouth every morning.     Multiple Vitamin (MULTIVITAMIN WITH MINERALS) TABS Take 1 tablet by mouth every other day.      naproxen (NAPROSYN) 375 MG tablet Take 1 tablet (375 mg total) by mouth 2 (two) times daily with a meal. 20 tablet 0   No current facility-administered medications for this visit.     Past Surgical History:  Procedure Laterality Date   COLONOSCOPY N/A 10/01/2020   Procedure: COLONOSCOPY;  Surgeon: Daneil Dolin, MD;  Location: [facility];  Service: Endoscopy;  Laterality: N/A;  ASA II / 9:30   POLYPECTOMY  10/01/2020   Procedure: POLYPECTOMY;  Surgeon: Daneil Dolin, MD;  Location: [facility];  Service: Endoscopy;;   TUBAL LIGATION       No Known Allergies    Family History  Problem Relation Age  of Onset   Diabetes Other    Cancer Other    Hypertension Other    Hypertension Mother    Diabetes Mother      Social History Nicole Guerrero reports that she quit smoking about 24 years ago. Her smoking use included cigarettes. She has a 0.18 pack-year smoking history. She has never used smokeless tobacco. Nicole Guerrero reports no history of alcohol use.   Review of Systems CONSTITUTIONAL: No weight loss, fever, chills, weakness or fatigue.  HEENT: Eyes: No visual loss, blurred vision, double vision or yellow sclerae.No hearing loss, sneezing, congestion, runny nose or sore throat.  SKIN: No rash or itching.  CARDIOVASCULAR: per hpi RESPIRATORY: No shortness of breath, cough or sputum.  GASTROINTESTINAL: No anorexia, nausea, vomiting or diarrhea. No abdominal pain or blood.  GENITOURINARY: No burning on urination, no polyuria NEUROLOGICAL: No headache, dizziness, syncope, paralysis, ataxia, numbness or tingling in the extremities. No change in bowel or bladder control.  MUSCULOSKELETAL: No muscle, back pain, joint pain or stiffness.  LYMPHATICS: No enlarged nodes. No history of splenectomy.  PSYCHIATRIC: No history of depression or anxiety.  ENDOCRINOLOGIC: No reports of sweating, cold or heat intolerance. No polyuria or polydipsia.  Marland Kitchen   Physical Examination Today's Vitals   11/18/21 0848 11/18/21 0922  BP: (!) 140/80 130/75  Pulse: 64   SpO2: 96%   Weight: 138 lb 12.8  oz (63 kg)   Height: '5\' 2"'$  (1.575 m)    Body mass index is 25.39 kg/m.  Gen: resting comfortably, no acute distress HEENT: no scleral icterus, pupils equal round and reactive, no palptable cervical adenopathy,  CV:RRR, no m/r/g, no jvd. +right carotid bruit Resp: Clear to auscultation bilaterally GI: abdomen is soft, non-tender, non-distended, normal bowel sounds, no hepatosplenomegaly MSK: extremities are warm, no edema.  Skin: warm, no rash Neuro:  no focal deficits Psych: appropriate  affect      Assessment and Plan  1.Chest pain -noncardiac chest pain, no further testing indicated  2. Palpitations - infrequent, short in duration - if progression could consider outpatient monitor  3. Carotid bruit - obtain carotid US     F/u pending, likely just as needed pending carotid US   Arnoldo Lenis, M.D.,

## 2021-11-24 ENCOUNTER — Telehealth: Payer: Self-pay | Admitting: Cardiology

## 2021-11-24 NOTE — Telephone Encounter (Signed)
Pt is returning call in regards to results. Transferred to Sung Amabile

## 2021-11-26 ENCOUNTER — Other Ambulatory Visit (HOSPITAL_COMMUNITY): Payer: Medicare PPO

## 2021-12-08 ENCOUNTER — Other Ambulatory Visit: Payer: Self-pay | Admitting: Cardiology

## 2021-12-08 DIAGNOSIS — I6523 Occlusion and stenosis of bilateral carotid arteries: Secondary | ICD-10-CM

## 2022-01-19 DIAGNOSIS — H5213 Myopia, bilateral: Secondary | ICD-10-CM | POA: Diagnosis not present

## 2022-01-19 DIAGNOSIS — H2513 Age-related nuclear cataract, bilateral: Secondary | ICD-10-CM | POA: Diagnosis not present

## 2022-01-19 DIAGNOSIS — H401131 Primary open-angle glaucoma, bilateral, mild stage: Secondary | ICD-10-CM | POA: Diagnosis not present

## 2022-03-25 ENCOUNTER — Other Ambulatory Visit (HOSPITAL_COMMUNITY): Payer: Self-pay | Admitting: Obstetrics

## 2022-03-25 DIAGNOSIS — Z1231 Encounter for screening mammogram for malignant neoplasm of breast: Secondary | ICD-10-CM

## 2022-04-15 DIAGNOSIS — M1991 Primary osteoarthritis, unspecified site: Secondary | ICD-10-CM | POA: Diagnosis not present

## 2022-04-15 DIAGNOSIS — E559 Vitamin D deficiency, unspecified: Secondary | ICD-10-CM | POA: Diagnosis not present

## 2022-04-15 DIAGNOSIS — R7309 Other abnormal glucose: Secondary | ICD-10-CM | POA: Diagnosis not present

## 2022-04-15 DIAGNOSIS — Z6825 Body mass index (BMI) 25.0-25.9, adult: Secondary | ICD-10-CM | POA: Diagnosis not present

## 2022-04-15 DIAGNOSIS — M5030 Other cervical disc degeneration, unspecified cervical region: Secondary | ICD-10-CM | POA: Diagnosis not present

## 2022-04-15 DIAGNOSIS — E119 Type 2 diabetes mellitus without complications: Secondary | ICD-10-CM | POA: Diagnosis not present

## 2022-04-15 DIAGNOSIS — E663 Overweight: Secondary | ICD-10-CM | POA: Diagnosis not present

## 2022-04-15 DIAGNOSIS — E782 Mixed hyperlipidemia: Secondary | ICD-10-CM | POA: Diagnosis not present

## 2022-04-15 DIAGNOSIS — R079 Chest pain, unspecified: Secondary | ICD-10-CM | POA: Diagnosis not present

## 2022-04-19 ENCOUNTER — Encounter: Payer: Self-pay | Admitting: *Deleted

## 2022-04-19 ENCOUNTER — Ambulatory Visit (HOSPITAL_COMMUNITY)
Admission: RE | Admit: 2022-04-19 | Discharge: 2022-04-19 | Disposition: A | Discharge status: Home / Self Care | Payer: Medicare PPO | Source: Ambulatory Visit | Attending: Obstetrics | Admitting: Obstetrics

## 2022-04-19 ENCOUNTER — Other Ambulatory Visit (HOSPITAL_COMMUNITY): Payer: Self-pay | Admitting: Family Medicine

## 2022-04-19 DIAGNOSIS — Z1231 Encounter for screening mammogram for malignant neoplasm of breast: Secondary | ICD-10-CM | POA: Insufficient documentation

## 2022-04-20 ENCOUNTER — Ambulatory Visit: Payer: Medicare PPO | Attending: Nurse Practitioner | Admitting: Nurse Practitioner

## 2022-04-20 ENCOUNTER — Telehealth: Payer: Self-pay | Admitting: Nurse Practitioner

## 2022-04-20 ENCOUNTER — Encounter: Payer: Self-pay | Admitting: Nurse Practitioner

## 2022-04-20 ENCOUNTER — Encounter: Payer: Self-pay | Admitting: *Deleted

## 2022-04-20 VITALS — BP 119/70 | HR 82 | Ht 62.0 in | Wt 135.0 lb

## 2022-04-20 DIAGNOSIS — R0602 Shortness of breath: Secondary | ICD-10-CM | POA: Diagnosis not present

## 2022-04-20 DIAGNOSIS — R600 Localized edema: Secondary | ICD-10-CM

## 2022-04-20 DIAGNOSIS — I6523 Occlusion and stenosis of bilateral carotid arteries: Secondary | ICD-10-CM

## 2022-04-20 DIAGNOSIS — E785 Hyperlipidemia, unspecified: Secondary | ICD-10-CM

## 2022-04-20 DIAGNOSIS — R0789 Other chest pain: Secondary | ICD-10-CM

## 2022-04-20 DIAGNOSIS — R079 Chest pain, unspecified: Secondary | ICD-10-CM | POA: Diagnosis not present

## 2022-04-20 MED ORDER — MEDICAL COMPRESSION SOCKS MISC: 1.0000 | 0 refills | Status: AC

## 2022-04-20 NOTE — Patient Instructions (Addendum)
Medication Instructions:  Your physician recommends that you continue on your current medications as directed. Please refer to the Current Medication list given to you today.  Labwork: none  Testing/Procedures: Your physician has requested that you have an echocardiogram. Echocardiography is a painless test that uses sound waves to create images of your heart. It provides your doctor with information about the size and shape of your heart and how well your heart's chambers and valves are working. This procedure takes approximately one hour. There are no restrictions for this procedure. Please do NOT wear cologne, perfume, aftershave, or lotions (deodorant is allowed). Please arrive 15 minutes prior to your appointment time. Your physician has requested that you have an exercise tolerance test. For further information please visit [email or web address]. Please also follow instruction sheet, as given.  Follow-Up: Your physician recommends that you schedule a follow-up appointment in: 8 weeks  Any Other Special Instructions Will Be Listed Below (If Applicable). Compression Stockings prescription given today  If you need a refill on your cardiac medications before your next appointment, please call your pharmacy.

## 2022-04-20 NOTE — Progress Notes (Unsigned)
Cardiology Office Note:    Date:  04/20/2022  ID:  Nicole Guerrero, DOB 1951-02-11, MRN [id number]  PCP:  Sharilyn Sites, [place] [facility] Cardiologist:  Carlyle Dolly, MD     Referring MD: Sharilyn Sites, MD   CC: Chest pain and shortness of breath  History of Present Illness:    Nicole Guerrero is a 72 y.o. female with a hx of the following:   History of chest pain History of palpitations GERD Carotid artery stenosis Hyperlipidemia  Patient is a very pleasant 72 year old female with past medical history as mentioned above.  Had an ED visit in June 2023 for chest pain that was reviewed producible with palpation, worse with movement.  Workup was overall unremarkable.  Last seen by Dr. Carlyle Dolly on November 18, 2021.  Patient noted infrequent intermittent palpitations over the years, lasting only few seconds, no specific triggers.  Carotid bruit noted on exam, and carotid US was arranged.  Bilateral ICAs revealed 40 to 59% stenosis.  Chest pain and palpitations will continue to be monitored.  Today she presents for evaluation for chest pain and shortness of breath at the request of her PCP. Symptoms have been ongoing intermittently x several months. CP is described as left sided and also occurs along upper back. Shortness of breath is not associated with episodes of chest pain, located along middle of chest/neck area. She says lying down helps alleviate her SHOB. CP rated 5/10, lasts only a few minutes in duration. Not typically associated with exertion, no CP at this time. Said baby aspirin has eased it up before. Denies any palpitations, syncope, presyncope, dizziness, orthopnea, PND, swelling or significant weight changes, acute bleeding, or claudication. Said she was referred from her PCP's office to have stress test performed. She states in 2000 she was hospitalized at [facility] for what appears to be viral pericarditis/with pericardial effusion and had fluid drained  off. She is very active and visits her mom regularly at [facility]. Denies any other questions or concerns today.   Past Medical History:  Diagnosis Date   Carotid artery stenosis    Bilateral ICA's = 40-59%   GERD (gastroesophageal reflux disease)    History of chest pain    History of palpitations    Hyperlipidemia    Viral endocarditis     Past Surgical History:  Procedure Laterality Date   COLONOSCOPY N/A 10/01/2020   Procedure: COLONOSCOPY;  Surgeon: Daneil Dolin, MD;  [facility];  Service: Endoscopy;  Laterality: N/A;  ASA II / 9:30   POLYPECTOMY  10/01/2020   Procedure: POLYPECTOMY;  Surgeon: Daneil Dolin, MD;  Location: [facility];  Service: Endoscopy;;   TUBAL LIGATION      Current Medications: Current Meds  Medication Sig   calcium carbonate (TUMS - DOSED IN MG ELEMENTAL CALCIUM) 500 MG chewable tablet Chew 1 tablet by mouth daily as needed for indigestion or heartburn.   Cholecalciferol (VITAMIN D) 2000 UNITS CAPS Take 2,000 Units by mouth every morning.   Elastic Bandages & Supports (MEDICAL COMPRESSION SOCKS) MISC 1 each by Does not apply route as directed. Knee High Compression Stockings with Low pressure Diagnosis: leg swelling   latanoprost (XALATAN) 0.005 % ophthalmic solution Place 1 drop into both eyes at bedtime.   Multiple Vitamin (MULTIVITAMIN WITH MINERALS) TABS Take 1 tablet by mouth every other day.      Allergies:   Patient has no known allergies.   Social  History   Socioeconomic History   Marital status: Divorced    Spouse name: Not on file   Number of children: Not on file   Years of education: Not on file   Highest education level: Not on file  Occupational History   Not on file  Tobacco Use   Smoking status: Former    Packs/day: 0.03    Years: 6.00    Total pack years: 0.18    Types: Cigarettes    Quit date: 03/22/1997    Years since quitting: 25.1   Smokeless tobacco: Never  Vaping Use   Vaping Use: Never  used  Substance and Sexual Activity   Alcohol use: No   Drug use: No   Sexual activity: Not Currently  Other Topics Concern   Not on file  Social History Narrative   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  ROS:   Review of Systems  Constitutional: Negative.   HENT: Negative.    Eyes: Negative.   Respiratory:  Positive for shortness of breath. Negative for cough, hemoptysis, sputum production and wheezing.   Cardiovascular:  Positive for chest pain. Negative for palpitations, orthopnea, claudication, leg swelling and PND.  Gastrointestinal: Negative.   Genitourinary: Negative.   Musculoskeletal: Negative.   Skin: Negative.   Neurological: Negative.   Endo/Heme/Allergies: Negative.   Psychiatric/Behavioral: Negative.      Please see the history of present illness.     All other systems reviewed and are negative.  EKGs/Labs/Other Studies Reviewed:    The following studies were reviewed today:   EKG:  EKG is not ordered today.  EKG dated April 14, 2022 reviewed and reveals normal sinus rhythm, 63 bpm with sinus arrhythmia, nonspecific ST segment changes, no acute ischemic changes.   Carotid duplex bilateral on 11/19/2021: Summary:  Right Carotid: Velocities in the right ICA are consistent with a 40-59%                 stenosis.   Left Carotid: Velocities in the left ICA are consistent with a 40-59%  stenosis.   Vertebrals: Bilateral vertebral arteries demonstrate  antegrade flow.  Subclavians: Normal flow hemodynamics were seen in bilateral subclavian               arteries.   *See table(s) above for measurements and observations.  Suggest follow up study in 12 months.   Recent Labs: 09/01/2021: BUN 9; Creatinine, Ser 0.78; Hemoglobin 12.7; Platelets 279; Potassium 4.2; Sodium 136  Recent Lipid Panel No results found for: "CHOL", "TRIG", "HDL", "CHOLHDL", "VLDL", "LDLCALC", "LDLDIRECT"   Physical Exam:    VS:  BP 119/70 (BP Location: Left Arm, Patient Position: Sitting, Cuff Size: Normal)   Pulse 82   Ht '5\' 2"'$  (1.575 m)   Wt 135 lb (61.2 kg)   SpO2 96%   BMI 24.69 kg/m     Wt Readings from Last 3 Encounters:  04/20/22 135 lb (61.2 kg)  11/18/21 138 lb 12.8  oz (63 kg)  09/01/21 134 lb (60.8 kg)     GEN: Well nourished, well developed in no acute distress HEENT: Normal NECK: No JVD; No carotid bruits CARDIAC: S1/S2, RRR, no murmurs, rubs, gallops; 2+ pulses RESPIRATORY:  Clear to auscultation without rales, wheezing or rhonchi  MUSCULOSKELETAL:  Trace, minimal edema to BLE; No deformity  SKIN: Warm and dry NEUROLOGIC:  Alert and oriented x 3 PSYCHIATRIC:  Normal affect   ASSESSMENT:    1. Chest pain of uncertain etiology   2. SOB (shortness of breath)   3. Leg edema   4. Bilateral carotid artery stenosis   5. Hyperlipidemia, unspecified hyperlipidemia type    PLAN:    In order of problems listed above:  Chest pain of uncertain etiology Etiology unclear. Has been ongoing intermittently x several months. CP is described as left sided and also occurs along upper back. Shortness of breath is not associated with episodes of chest pain. CP rated 5/10, lasts only a few minutes in duration. Not typically associated with exertion, no CP at this time. Said baby aspirin has eased it up before. No CP today. Recent EKG with PCP reviewed and overall unremarkable for anything acute. Last ischemic eval was many years ago. Discussed different  options regarding ischemic evaluation and she is agreeable with ETT, risks and benefits discussed below. Will also arrange 2D Echo. Heart healthy diet and regular cardiovascular exercise encouraged. ED precautions discussed.   Shared Decision Making/Informed Consent The risks [chest pain, shortness of breath, cardiac arrhythmias, dizziness, blood pressure fluctuations, myocardial infarction, stroke/transient ischemic attack, and life-threatening complications (estimated to be 1 in 10,000)], benefits (risk stratification, diagnosing coronary artery disease, treatment guidance) and alternatives of an exercise tolerance test were discussed in detail with Ms. Vandergrift and she agrees to proceed.  2. Shortness of breath Not associated with her episodes of chest pain. Alleviated with laying down. Located along middle of chest/neck area. Will r/o cardiac causes by arranging 2D Echocardiogram. If Echo results, WNL and symptoms do not resolve, consider pulmonology referral for r/o of asthma/COPD. Heart healthy diet and regular cardiovascular exercise encouraged. ED precautions discussed.   3. Leg edema Trace, minimal nonpitting edema noted to both lower extremities. Will write Rx for compression stockings.   4. Carotid artery stenosis, hyperlipidemia Carotid doppler in 10/2021 revealed bilateral ICA stenosis at 40-59%. Pt is asymptomatic. Recent labs with PCP revealed LDL minimally elevated. Pt is agreeable to work on lifestyle modifications and plan to update FLP in 6 months, if no improvement, consider starting statin.   4. Disposition: Follow-up with me or APP in 8 weeks or sooner if anything changes. ED precautions discussed.    Medication Adjustments/Labs and Tests Ordered: Current medicines are reviewed at length with the patient today.  Concerns regarding medicines are outlined above.  Orders Placed This Encounter  Procedures   Cardiac Stress Test: Informed Consent Details: Physician/Practitioner  Attestation; Transcribe to consent form and obtain patient signature   EXERCISE TOLERANCE TEST (ETT)   ECHOCARDIOGRAM COMPLETE   Meds ordered this encounter  Medications   Elastic Bandages & Supports (MEDICAL COMPRESSION SOCKS) MISC    Sig: 1 each by Does not apply route as directed. Knee High Compression Stockings with Low pressure Diagnosis: leg swelling    Dispense:  1 each    Refill:  0    Patient Instructions  Medication Instructions:  Your physician recommends that you continue on your current medications as directed. Please refer to the Current Medication list given  to you today.  Labwork: none  Testing/Procedures: Your physician has requested that you have an echocardiogram. Echocardiography is a painless test that uses sound waves to create images of your heart. It provides your doctor with information about the size and shape of your heart and how well your heart's chambers and valves are working. This procedure takes approximately one hour. There are no restrictions for this procedure. Please do NOT wear cologne, perfume, aftershave, or lotions (deodorant is allowed). Please arrive 15 minutes prior to your appointment time. Your physician has requested that you have an exercise tolerance test. For further information please visit [email or web address]. Please also follow instruction sheet, as given.  Follow-Up: Your physician recommends that you schedule a follow-up appointment in: 8 weeks  Any Other Special Instructions Will Be Listed Below (If Applicable). Compression Stockings prescription given today  If you need a refill on your cardiac medications before your next appointment, please call your pharmacy.   Signed, Finis Bud, NP  04/22/2022 8:49 AM    [place]

## 2022-04-20 NOTE — Telephone Encounter (Signed)
Checking percert on the following patient for testing scheduled at [facility]    [facility] 04/26/2022

## 2022-04-21 ENCOUNTER — Encounter: Payer: Self-pay | Admitting: Cardiology

## 2022-04-21 DIAGNOSIS — M1991 Primary osteoarthritis, unspecified site: Secondary | ICD-10-CM | POA: Diagnosis not present

## 2022-04-22 ENCOUNTER — Ambulatory Visit: Payer: Medicare PPO | Admitting: Obstetrics

## 2022-04-22 ENCOUNTER — Encounter: Payer: Self-pay | Admitting: Nurse Practitioner

## 2022-04-26 ENCOUNTER — Encounter: Payer: Self-pay | Admitting: Physician Assistant

## 2022-04-26 ENCOUNTER — Ambulatory Visit (HOSPITAL_COMMUNITY)
Admission: RE | Admit: 2022-04-26 | Discharge: 2022-04-26 | Disposition: A | Discharge status: Home / Self Care | Payer: Medicare PPO | Source: Ambulatory Visit | Attending: Nurse Practitioner | Admitting: Nurse Practitioner

## 2022-04-26 DIAGNOSIS — R079 Chest pain, unspecified: Secondary | ICD-10-CM | POA: Insufficient documentation

## 2022-04-26 LAB — EXERCISE TOLERANCE TEST
Angina Index: 0
Duke Treadmill Score: 3
Estimated workload: 5.4
Exercise duration (min): 3 min
Exercise duration (sec): 21 s
MPHR: 148 {beats}/min
Peak HR: 133 {beats}/min
Percent HR: 89 %
RPE: 13
Rest HR: 72 {beats}/min
ST Depression (mm): 0 mm

## 2022-04-26 NOTE — Progress Notes (Signed)
Pt arrived to [facility] stress test dept today in stable condition, VSS, asymptomatic. EKG showed NSR with nonspecific TWI inferiorly and V4-V6, new from 08/2021 tracing but previously seen in similar territory in 2022. Per d/w Dr. Harl Bowie, [place] to proceed. Pt tolerated test well except does have chronic gait abnormality with subtle limp. She wanted to try exercising and did well with stage 1. She wished to try moving onto stage 2. THR quickly achieved in beginning of stage 2 then test stopped to avoid fall risk. Final read forthcoming.

## 2022-05-11 ENCOUNTER — Ambulatory Visit: Payer: Medicare PPO | Attending: Nurse Practitioner

## 2022-05-11 DIAGNOSIS — R0602 Shortness of breath: Secondary | ICD-10-CM

## 2022-05-11 LAB — ECHOCARDIOGRAM COMPLETE
AR max vel: 2.69 cm2
AV Area VTI: 3.28 cm2
AV Area mean vel: 3.02 cm2
AV Mean grad: 1.7 mmHg
AV Peak grad: 3.5 mmHg
Ao pk vel: 0.93 m/s
Area-P 1/2: 2.82 cm2
Calc EF: 58 %
MV M vel: 4.29 m/s
MV Peak grad: 73.4 mmHg
S' Lateral: 2.3 cm
Single Plane A2C EF: 56.3 %
Single Plane A4C EF: 59.9 %

## 2022-05-17 ENCOUNTER — Telehealth: Payer: Self-pay | Admitting: Nurse Practitioner

## 2022-05-17 NOTE — Telephone Encounter (Signed)
Pt returning nurses call. Please regard.

## 2022-06-15 ENCOUNTER — Encounter: Payer: Self-pay | Admitting: Nurse Practitioner

## 2022-06-15 ENCOUNTER — Ambulatory Visit: Payer: Medicare PPO | Attending: Nurse Practitioner | Admitting: Nurse Practitioner

## 2022-06-15 VITALS — BP 120/70 | HR 68 | Ht 62.0 in | Wt 133.0 lb

## 2022-06-15 DIAGNOSIS — I6523 Occlusion and stenosis of bilateral carotid arteries: Secondary | ICD-10-CM

## 2022-06-15 DIAGNOSIS — E785 Hyperlipidemia, unspecified: Secondary | ICD-10-CM | POA: Diagnosis not present

## 2022-06-15 DIAGNOSIS — Z87898 Personal history of other specified conditions: Secondary | ICD-10-CM

## 2022-06-15 DIAGNOSIS — R600 Localized edema: Secondary | ICD-10-CM

## 2022-06-15 NOTE — Progress Notes (Signed)
Cardiology Office Note:    Date:  06/15/2022  ID:  Nicole Guerrero, DOB [birth year], MRN [id number]  PCP:  Nicole Guerrero, [place] [facility] Cardiologist:  Carlyle Dolly, MD     Referring MD: Nicole Sites, MD   CC: Chest pain and shortness of breath follow-up  History of Present Illness:    Nicole Guerrero is a very pleasant 72 y.o. female with a hx of the following:   History of chest pain History of palpitations GERD Carotid artery stenosis Hyperlipidemia  She states in 2000 she was hospitalized at [facility] for what appears to be viral pericarditis/with pericardial effusion and had fluid drained off. Had an ED visit in June 2023 for chest pain that was reviewed producible with palpation, worse with movement.  Workup was overall unremarkable.  Seen by Dr. Carlyle Dolly on November 18, 2021.  Patient noted infrequent intermittent palpitations over the years, lasting only few seconds, no specific triggers.  Carotid bruit noted on exam, and carotid US was arranged.  Bilateral ICAs revealed 40 to 59% stenosis.  Chest pain and palpitations will continue to be monitored.  Last saw this pt on 04/20/2022 for evaluation for chest pain and shortness of breath at the request of her PCP. Symptoms were ongoing intermittently x several months. CP described as left sided and also occurs along upper back. Shortness of breath not associated with episodes of chest pain, located along middle of chest/neck area, lying down helped alleviate her SHOB. CP 5/10, lasted only a few minutes in duration. Not typically associated with exertion.   ETT 04/2022 was unable to be completed d/t balance concerns. Was unable to calculate treadmill score. Baseline inferior and lateral precordial T-wave inversions. Test was considered a negative exercise stress test for ischemia. TTE was updated and showed normal EF, grade 1 DD, mild asymmetric LVH, mild MR.   Today she presents for follow-up. Does admit to  recent MSK symptoms, but overall doing well. Denies any recent chest pain, shortness of breath, palpitations, syncope, presyncope, dizziness, orthopnea, PND, swelling or significant weight changes, acute bleeding, or claudication. Tolerating medications well.   SH: She is very active and visits her mom regularly at [facility].  Past Medical History:  Diagnosis Date   Carotid artery stenosis    Bilateral ICA's = 40-59%   GERD (gastroesophageal reflux disease)    History of chest pain    History of palpitations    Hyperlipidemia    Viral endocarditis     Past Surgical History:  Procedure Laterality Date   COLONOSCOPY N/A 10/01/2020   Procedure: COLONOSCOPY;  Surgeon: Daneil Dolin, MD;  [facility];  Service: Endoscopy;  Laterality: N/A;  ASA II / 9:30   POLYPECTOMY  10/01/2020   Procedure: POLYPECTOMY;  Surgeon: Daneil Dolin, MD;  [facility];  Service: Endoscopy;;   TUBAL LIGATION      Current Medications: Current Meds  Medication Sig   calcium carbonate (TUMS - DOSED IN MG ELEMENTAL CALCIUM) 500 MG chewable tablet Chew 1 tablet by mouth daily as needed for indigestion or heartburn.   Cholecalciferol (VITAMIN D) 2000 UNITS CAPS Take 2,000 Units by mouth every morning.   Elastic Bandages & Supports (MEDICAL COMPRESSION SOCKS) MISC 1 each by Does not apply route as directed. Knee High Compression Stockings with Low pressure Diagnosis: leg swelling   latanoprost (XALATAN) 0.005 % ophthalmic solution Place 1 drop into both eyes at bedtime.   Multiple  Vitamin (MULTIVITAMIN WITH MINERALS) TABS Take 1 tablet by mouth every other day.      Allergies:   Patient has no known allergies.   Social History   Socioeconomic History   Marital status: Divorced    Spouse name: Not on file   Number of children: Not on file   Years of education: Not on file   Highest education level: Not on file  Occupational History   Not on file  Tobacco Use   Smoking  status: Former    Packs/day: 0.03    Years: 6.00    Additional pack years: 0.00    Total pack years: 0.18    Types: Cigarettes    Quit date: 03/22/1997    Years since quitting: 25.2    Passive exposure: Never   Smokeless tobacco: Never  Vaping Use   Vaping Use: Never used  Substance and Sexual Activity   Alcohol use: No   Drug use: No   Sexual activity: Not Currently  Other Topics Concern   Not on file  Social History Narrative   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  ROS:     Please see the history of present illness.     All other systems reviewed and are negative.  EKGs/Labs/Other Studies Reviewed:    The following studies were reviewed today:   EKG:  EKG is not ordered today.  EKG dated April 14, 2022 reviewed and reveals normal sinus rhythm, 63 bpm with sinus arrhythmia, nonspecific ST segment changes, no acute ischemic changes.  TTE 04/2022:  1. Left ventricular ejection fraction, by estimation, is 55 to 60%. The  left ventricle has normal function. The left ventricle has no regional  wall motion abnormalities. There is mild asymmetric left ventricular  hypertrophy of the basal-septal segment.  Left ventricular diastolic parameters are consistent with Grade I  diastolic dysfunction (impaired relaxation). The average left ventricular  global longitudinal strain is -19.8 %. The global longitudinal strain is  normal.   2. Right ventricular systolic function is  normal. The right ventricular  size is normal.   3. The mitral valve is abnormal. Mild mitral valve regurgitation. No  evidence of mitral stenosis.   4. The tricuspid valve is abnormal.   5. The aortic valve is tricuspid. Aortic valve regurgitation is not  visualized. No aortic stenosis is present.   6. The inferior vena cava is normal in size with greater than 50%  respiratory variability, suggesting right atrial pressure of 3 mmHg.   ETT 04/2022:   Baseline inferior and lateral precrodial T-wave inversions.   No ST deviation was noted.   Negative exercise stress test for ischemia   Test not indicative of full exercise capacity, test stopped early due to limping and balance concerns. Cannot calculate Duke treadmill score for this reason.    Carotid duplex bilateral on 11/19/2021: Summary:  Right Carotid: Velocities in the right ICA are consistent with a 40-59%  stenosis.   Left Carotid: Velocities in the left ICA are consistent with a 40-59%  stenosis.   Vertebrals: Bilateral vertebral arteries demonstrate antegrade flow.  Subclavians: Normal flow hemodynamics were seen in bilateral subclavian               arteries.   *See table(s) above for measurements and observations.  Suggest follow up study in 12 months.   Recent Labs: 09/01/2021: BUN 9; Creatinine, Ser 0.78; Hemoglobin 12.7; Platelets 279; Potassium 4.2; Sodium 136  Recent Lipid Panel No results found for: "CHOL", "TRIG", "HDL", "CHOLHDL", "VLDL", "LDLCALC", "LDLDIRECT"   Physical Exam:    VS:  BP 120/70 (BP Location: Right Arm, Patient Position: Sitting, Cuff Size: Normal)   Pulse 68   Ht 5\' 2"  (1.575 m)   Wt 133 lb (60.3 kg)   SpO2 98%   BMI 24.33 kg/m     Wt Readings from Last 3 Encounters:  06/15/22 133 lb (60.3 kg)  04/20/22 135 lb (61.2 kg)  11/18/21 138 lb 12.8 oz (63 kg)     GEN: Well nourished, well developed in no acute distress HEENT: Normal NECK: No JVD; No carotid  bruits CARDIAC: S1/S2, RRR, no murmurs, rubs, gallops; 2+ pulses RESPIRATORY:  Clear to auscultation without rales, wheezing or rhonchi  MUSCULOSKELETAL:  No edema to BLE; No deformity  SKIN: Warm and dry NEUROLOGIC:  Alert and oriented x 3 PSYCHIATRIC:  Normal affect   ASSESSMENT:    1. History of chest pain   2. Leg edema   3. Bilateral carotid artery stenosis   4. Hyperlipidemia, unspecified hyperlipidemia type     PLAN:    In order of problems listed above:  Hx of chest pain Etiology atypical, no recent CP, sounds MSK in etiology. Was unable to complete ETT as mentioned above. Will continue to monitor and watch for now before considering NST/CCTA. If symptoms recur or progress, will consider ischemic evaluation. Heart healthy diet and regular cardiovascular exercise encouraged. ED precautions discussed.   2. Leg edema No edema noted to both lower extremities. Encouraged compression stockings PRN. Heart healthy diet and regular cardiovascular exercise encouraged.   3. Carotid artery stenosis, hyperlipidemia Carotid doppler in 10/2021 revealed bilateral ICA stenosis at 40-59%. Pt is asymptomatic. Past labs with PCP revealed LDL minimally elevated.  At next OV, plan to update/discuss obtaining FLP, if no improvement, consider starting statin.   4. Disposition: Follow-up with me or APP in 3-4 months or sooner if anything changes. ED precautions discussed.    Medication Adjustments/Labs and Tests Ordered: Current medicines are reviewed at length with the patient today.  Concerns regarding medicines are outlined above.  No orders of the defined types were placed in this encounter.  No orders of the defined types were placed in this encounter.   Patient Instructions  Medication Instructions:  Your physician recommends that you continue on your current medications as directed. Please refer to the Current Medication list given to you today.    Labwork: none  Testing/Procedures: none  Follow-Up:  Your physician recommends that you schedule a follow-up appointment in: 3-4 months  Any Other Special Instructions Will Be Listed Below (If Applicable).  If you need a refill on your cardiac medications before your next appointment, please call your pharmacy.      Signed, Finis Bud, NP  06/19/2022 3:04 PM    [place]

## 2022-06-15 NOTE — Patient Instructions (Addendum)
Medication Instructions:  Your physician recommends that you continue on your current medications as directed. Please refer to the Current Medication list given to you today.   Labwork: none  Testing/Procedures: none  Follow-Up: Your physician recommends that you schedule a follow-up appointment in: 3-4 months.     Any Other Special Instructions Will Be Listed Below (If Applicable).     If you need a refill on your cardiac medications before your next appointment, please call your pharmacy.  . 

## 2022-08-24 DIAGNOSIS — H401131 Primary open-angle glaucoma, bilateral, mild stage: Secondary | ICD-10-CM | POA: Diagnosis not present

## 2022-08-24 DIAGNOSIS — H2513 Age-related nuclear cataract, bilateral: Secondary | ICD-10-CM | POA: Diagnosis not present

## 2022-09-20 DIAGNOSIS — H409 Unspecified glaucoma: Secondary | ICD-10-CM | POA: Diagnosis not present

## 2022-09-20 DIAGNOSIS — I129 Hypertensive chronic kidney disease with stage 1 through stage 4 chronic kidney disease, or unspecified chronic kidney disease: Secondary | ICD-10-CM | POA: Diagnosis not present

## 2022-09-20 DIAGNOSIS — K219 Gastro-esophageal reflux disease without esophagitis: Secondary | ICD-10-CM | POA: Diagnosis not present

## 2022-09-20 DIAGNOSIS — I499 Cardiac arrhythmia, unspecified: Secondary | ICD-10-CM | POA: Diagnosis not present

## 2022-09-20 DIAGNOSIS — G379 Demyelinating disease of central nervous system, unspecified: Secondary | ICD-10-CM | POA: Diagnosis not present

## 2022-09-20 DIAGNOSIS — M1990 Unspecified osteoarthritis, unspecified site: Secondary | ICD-10-CM | POA: Diagnosis not present

## 2022-09-20 DIAGNOSIS — N393 Stress incontinence (female) (male): Secondary | ICD-10-CM | POA: Diagnosis not present

## 2022-09-20 DIAGNOSIS — I252 Old myocardial infarction: Secondary | ICD-10-CM | POA: Diagnosis not present

## 2022-09-20 DIAGNOSIS — N182 Chronic kidney disease, stage 2 (mild): Secondary | ICD-10-CM | POA: Diagnosis not present

## 2022-10-14 NOTE — Progress Notes (Signed)
Cardiology Office Note    Date:  10/15/2022  ID:  Nicole Guerrero, DOB 1950/07/09, MRN [id number] Cardiologist: Dina Rich, MD    History of Present Illness:    Nicole Guerrero is a 72 y.o. female with a past medical history of atypical chest pain, palpitations, carotid artery stenosis and GERD who presents to the office today for 69-month follow-up.  She was last examined by Sharlene Dory, NP in 05/2022 and reported recent musculoskeletal pain but denied any recent anginal symptoms. Recent ETT had shown baseline TWI but no acute changes. Given that her pain was overall felt to be atypical, further cardiac testing was not pursued. It was recommended at her next visit to obtain an updated FLP given prior carotid artery stenosis.  In talking with the patient today, she reports overall doing well since her last office visit. She denies any recent chest pain or dyspnea on exertion. Does remain active at baseline and recently push mowed her yard last week without any anginal symptoms. She denies any specific orthopnea, PND or pitting edema. Says she does notice a sock indentation along her legs at times and was previously using compression stockings but does not routinely wear these during the summer months.  Studies Reviewed:   EKG: EKG is ordered today and demonstrates:    EKG Interpretation Date/Time:  Friday October 15 2022 13:11:37 EDT Ventricular Rate:  62 PR Interval:  156 QRS Duration:  72 QT Interval:  430 QTC Calculation: 436 R Axis:   54  Text Interpretation: Normal sinus rhythm No acute changes Confirmed by Randall An ([id number]) on 10/15/2022 1:49:39 PM        Carotid Dopplers: 10/2021 Summary:  Right Carotid: Velocities in the right ICA are consistent with a 40-59%                 stenosis.   Left Carotid: Velocities in the left ICA are consistent with a 40-59%  stenosis.   Vertebrals: Bilateral vertebral arteries demonstrate antegrade flow.  Subclavians: Normal  flow hemodynamics were seen in bilateral subclavian               arteries.   GXT: 04/2022   Baseline inferior and lateral precrodial T-wave inversions.   No ST deviation was noted.   Negative exercise stress test for ischemia   Test not indicative of full exercise capacity, test stopped early due to limping and balance concerns. Cannot calculate Duke treadmill score for this reason.   Echocardiogram: 04/2022 IMPRESSIONS     1. Left ventricular ejection fraction, by estimation, is 55 to 60%. The  left ventricle has normal function. The left ventricle has no regional  wall motion abnormalities. There is mild asymmetric left ventricular  hypertrophy of the basal-septal segment.  Left ventricular diastolic parameters are consistent with Grade I  diastolic dysfunction (impaired relaxation). The average left ventricular  global longitudinal strain is -19.8 %. The global longitudinal strain is  normal.   2. Right ventricular systolic function is normal. The right ventricular  size is normal.   3. The mitral valve is abnormal. Mild mitral valve regurgitation. No  evidence of mitral stenosis.   4. The tricuspid valve is abnormal.   5. The aortic valve is tricuspid. Aortic valve regurgitation is not  visualized. No aortic stenosis is present.   6. The inferior vena cava is normal in size with greater than 50%  respiratory variability, suggesting right atrial pressure of 3 mmHg.    Physical Exam:  VS:  BP 122/64   Pulse 62   Ht 5\' 2"  (1.575 m)   Wt 134 lb (60.8 kg)   SpO2 99%   BMI 24.51 kg/m    Wt Readings from Last 3 Encounters:  10/15/22 134 lb (60.8 kg)  06/15/22 133 lb (60.3 kg)  04/20/22 135 lb (61.2 kg)     GEN: Pleasant female appearing in no acute distress NECK: No JVD; No carotid bruits CARDIAC: RRR, no murmurs, rubs, gallops RESPIRATORY:  Clear to auscultation without rales, wheezing or rhonchi  ABDOMEN: Appears non-distended. No obvious abdominal  masses. EXTREMITIES: No clubbing or cyanosis. No pitting edema.  Distal pedal pulses are 2+ bilaterally.   Assessment and Plan:   1. Atypical Chest Pain - GXT in 04/2022 showed no ischemic changes and echocardiogram showed a preserved EF of 55 to 60% with no regional wall motion abnormalities. - She is active at baseline and denies any recent anginal symptoms. We reviewed warning signs to monitor for and if she has recurrent chest pain, would plan for a Coronary CTA. Continue with risk factor modification at this time.   2. Palpitations - She denies any recent symptoms. No current indication for medical therapy.   3. Carotid Artery Stenosis - Dopplers in 10/2021 showed 40 to 59% stenosis bilaterally with follow-up imaging recommended in 1 year. Will plan for a follow-up doppler study later this year. She is not currently on statin therapy and does have follow-up labs with her PCP scheduled in 10/2022. If LDL remains above goal, could consider low-dose Crestor.    Disposition: Will plan for follow-up in 1 year unless clinically indicated in the interim.   Signed, Ellsworth Lennox, PA-C

## 2022-10-15 ENCOUNTER — Ambulatory Visit: Payer: Medicare PPO | Admitting: Student

## 2022-10-15 ENCOUNTER — Encounter: Payer: Self-pay | Admitting: Student

## 2022-10-15 VITALS — BP 122/64 | HR 62 | Ht 62.0 in | Wt 134.0 lb

## 2022-10-15 DIAGNOSIS — Z87898 Personal history of other specified conditions: Secondary | ICD-10-CM

## 2022-10-15 DIAGNOSIS — I6523 Occlusion and stenosis of bilateral carotid arteries: Secondary | ICD-10-CM

## 2022-10-15 DIAGNOSIS — R002 Palpitations: Secondary | ICD-10-CM | POA: Diagnosis not present

## 2022-10-15 NOTE — Patient Instructions (Signed)
Medication Instructions:  Your physician recommends that you continue on your current medications as directed. Please refer to the Current Medication list given to you today.  *If you need a refill on your cardiac medications before your next appointment, please call your pharmacy*   Lab Work: NONE   If you have labs (blood work) drawn today and your tests are completely normal, you will receive your results only by: MyChart Message (if you have MyChart) OR A paper copy in the mail If you have any lab test that is abnormal or we need to change your treatment, we will call you to review the results.   Testing/Procedures: Your physician has requested that you have a carotid duplex. This test is an ultrasound of the carotid arteries in your neck. It looks at blood flow through these arteries that supply the brain with blood. Allow one hour for this exam. There are no restrictions or special instructions.    Follow-Up: At [facility], you and your health needs are our priority.  As part of our continuing mission to provide you with exceptional heart care, we have created designated Provider Care Teams.  These Care Teams include your primary Cardiologist (physician) and Advanced Practice Providers (APPs -  Physician Assistants and Nurse Practitioners) who all work together to provide you with the care you need, when you need it.  We recommend signing up for the patient portal called "MyChart".  Sign up information is provided on this After Visit Summary.  MyChart is used to connect with patients for Virtual Visits (Telemedicine).  Patients are able to view lab/test results, encounter notes, upcoming appointments, etc.  Non-urgent messages can be sent to your provider as well.   To learn more about what you can do with MyChart, go to [email or web address].    Your next appointment:   1 year(s)  Provider:   You may see Dina Rich, MD or one of the following Advanced Practice  Providers on your designated Care Team:   Randall An, PA-C  Jacolyn Reedy, [place]     Other Instructions Thank you for choosing [facility]!

## 2022-10-22 DIAGNOSIS — E7849 Other hyperlipidemia: Secondary | ICD-10-CM | POA: Diagnosis not present

## 2022-10-22 DIAGNOSIS — Z6825 Body mass index (BMI) 25.0-25.9, adult: Secondary | ICD-10-CM | POA: Diagnosis not present

## 2022-10-22 DIAGNOSIS — M1991 Primary osteoarthritis, unspecified site: Secondary | ICD-10-CM | POA: Diagnosis not present

## 2022-10-22 DIAGNOSIS — Z1331 Encounter for screening for depression: Secondary | ICD-10-CM | POA: Diagnosis not present

## 2022-10-22 DIAGNOSIS — J302 Other seasonal allergic rhinitis: Secondary | ICD-10-CM | POA: Diagnosis not present

## 2022-10-22 DIAGNOSIS — R7309 Other abnormal glucose: Secondary | ICD-10-CM | POA: Diagnosis not present

## 2022-10-22 DIAGNOSIS — Z0001 Encounter for general adult medical examination with abnormal findings: Secondary | ICD-10-CM | POA: Diagnosis not present

## 2022-10-22 DIAGNOSIS — M5030 Other cervical disc degeneration, unspecified cervical region: Secondary | ICD-10-CM | POA: Diagnosis not present

## 2022-10-22 DIAGNOSIS — M545 Low back pain, unspecified: Secondary | ICD-10-CM | POA: Diagnosis not present

## 2022-10-22 DIAGNOSIS — E663 Overweight: Secondary | ICD-10-CM | POA: Diagnosis not present

## 2022-10-22 DIAGNOSIS — E782 Mixed hyperlipidemia: Secondary | ICD-10-CM | POA: Diagnosis not present

## 2022-11-29 ENCOUNTER — Ambulatory Visit (HOSPITAL_COMMUNITY)
Admission: RE | Admit: 2022-11-29 | Discharge: 2022-11-29 | Disposition: A | Discharge status: Home / Self Care | Payer: Medicare PPO | Source: Ambulatory Visit | Attending: Student | Admitting: Student

## 2022-11-29 DIAGNOSIS — I6523 Occlusion and stenosis of bilateral carotid arteries: Secondary | ICD-10-CM | POA: Diagnosis not present

## 2022-11-30 ENCOUNTER — Other Ambulatory Visit: Payer: Self-pay

## 2022-11-30 ENCOUNTER — Telehealth: Payer: Self-pay

## 2022-11-30 DIAGNOSIS — E785 Hyperlipidemia, unspecified: Secondary | ICD-10-CM

## 2022-11-30 MED ORDER — ROSUVASTATIN CALCIUM 10 MG PO TABS: 10.0000 mg | ORAL_TABLET | Freq: Every day | ORAL | 3 refills | Status: AC

## 2022-11-30 NOTE — Telephone Encounter (Signed)
-----   Message from [place] sent at 11/29/2022  8:21 PM EDT ----- Please let the patient know her carotid doppler study does suggest the plaque along her carotid arteries has progressed and is at 50-69% bilaterally. By review of [facility], her recent lipid panel showed her LDL was at 97. We want this to be less than 70 to reduce plaque buildup. I would recommend starting low-dose Crestor 10mg  daily to help with cholesterol. Biggest side effect can be muscle aches so she should make [place] aware if this occurs. If in agreement with starting Crestor, would recheck FLP and LFT's in 2 months. Would also try to follow a heart-healthy diet (limiting red meat, fast food, gravy, butter, etc.).

## 2022-11-30 NOTE — Telephone Encounter (Signed)
Patient notified and verbalized understanding. Patient agreeable with starting Crestor 10 mg tablet once daily and labs in 36m. Pt had no further questions or concerns at this time. PCP copied.

## 2023-03-04 DIAGNOSIS — H2513 Age-related nuclear cataract, bilateral: Secondary | ICD-10-CM | POA: Diagnosis not present

## 2023-03-04 DIAGNOSIS — H5213 Myopia, bilateral: Secondary | ICD-10-CM | POA: Diagnosis not present

## 2023-03-04 DIAGNOSIS — H401131 Primary open-angle glaucoma, bilateral, mild stage: Secondary | ICD-10-CM | POA: Diagnosis not present

## 2023-04-04 ENCOUNTER — Other Ambulatory Visit (HOSPITAL_COMMUNITY): Payer: Self-pay | Admitting: Family Medicine

## 2023-04-04 DIAGNOSIS — R059 Cough, unspecified: Secondary | ICD-10-CM | POA: Diagnosis not present

## 2023-04-04 DIAGNOSIS — R509 Fever, unspecified: Secondary | ICD-10-CM | POA: Diagnosis not present

## 2023-04-04 DIAGNOSIS — Z1231 Encounter for screening mammogram for malignant neoplasm of breast: Secondary | ICD-10-CM

## 2023-04-04 DIAGNOSIS — J069 Acute upper respiratory infection, unspecified: Secondary | ICD-10-CM | POA: Diagnosis not present

## 2023-04-22 ENCOUNTER — Ambulatory Visit (HOSPITAL_COMMUNITY)
Admission: RE | Admit: 2023-04-22 | Discharge: 2023-04-22 | Disposition: A | Discharge status: Home / Self Care | Payer: Medicare PPO | Source: Ambulatory Visit | Attending: Family Medicine | Admitting: Family Medicine

## 2023-04-22 ENCOUNTER — Encounter (HOSPITAL_COMMUNITY): Payer: Self-pay

## 2023-04-22 DIAGNOSIS — Z1231 Encounter for screening mammogram for malignant neoplasm of breast: Secondary | ICD-10-CM | POA: Diagnosis not present

## 2023-05-05 DIAGNOSIS — E782 Mixed hyperlipidemia: Secondary | ICD-10-CM | POA: Diagnosis not present

## 2023-05-05 DIAGNOSIS — Z6823 Body mass index (BMI) 23.0-23.9, adult: Secondary | ICD-10-CM | POA: Diagnosis not present

## 2023-05-05 DIAGNOSIS — E7849 Other hyperlipidemia: Secondary | ICD-10-CM | POA: Diagnosis not present

## 2023-05-05 DIAGNOSIS — R7303 Prediabetes: Secondary | ICD-10-CM | POA: Diagnosis not present

## 2023-08-11 DIAGNOSIS — E7849 Other hyperlipidemia: Secondary | ICD-10-CM | POA: Diagnosis not present

## 2023-08-11 DIAGNOSIS — M1991 Primary osteoarthritis, unspecified site: Secondary | ICD-10-CM | POA: Diagnosis not present

## 2023-08-11 DIAGNOSIS — R0789 Other chest pain: Secondary | ICD-10-CM | POA: Diagnosis not present

## 2023-08-11 DIAGNOSIS — Z6823 Body mass index (BMI) 23.0-23.9, adult: Secondary | ICD-10-CM | POA: Diagnosis not present

## 2023-08-11 DIAGNOSIS — M5030 Other cervical disc degeneration, unspecified cervical region: Secondary | ICD-10-CM | POA: Diagnosis not present

## 2023-09-02 DIAGNOSIS — H401132 Primary open-angle glaucoma, bilateral, moderate stage: Secondary | ICD-10-CM | POA: Diagnosis not present

## 2023-09-16 DIAGNOSIS — R030 Elevated blood-pressure reading, without diagnosis of hypertension: Secondary | ICD-10-CM | POA: Diagnosis not present

## 2023-09-16 DIAGNOSIS — T63441A Toxic effect of venom of bees, accidental (unintentional), initial encounter: Secondary | ICD-10-CM | POA: Diagnosis not present

## 2023-09-16 DIAGNOSIS — M79602 Pain in left arm: Secondary | ICD-10-CM | POA: Diagnosis not present

## 2023-09-16 DIAGNOSIS — Z6822 Body mass index (BMI) 22.0-22.9, adult: Secondary | ICD-10-CM | POA: Diagnosis not present

## 2023-11-02 NOTE — Progress Notes (Signed)
 Cardiology Office Note    Date:  11/03/2023  ID:  Nicole Guerrero, DOB 09-10-1950, MRN [id number] Cardiologist: Alvan Carrier, MD Cardiology APP:  Johnson Laymon HERO, PA-C { :  History of Present Illness:    Nicole Guerrero is a 73 y.o. female with past medical history of atypical chest pain, palpitations, carotid artery stenosis and GERD who presents to the office today for annual follow-up.  She was last examined by myself in 09/2022 and denied any recent chest pain or dyspnea on exertion at that time. Follow-up carotid dopplers were obtained and showed 50 to 69% stenosis and she was started on Crestor  10 mg daily.  In talking with the patient today, she reports it has been a stressful year as several family members passed away earlier this year, including her mother who was 65 years old. She does try to stay active around her home and has been doing yard work and pulling weeds in flower beds. Denies any recent exertional chest pain or dyspnea on exertion. Reports an occasional discomfort along her left pectoral region in the mornings which occurs when she presses in on her chest. She feels like this is possibly due to the way she sleeps at night. No exertional component though. Reports having occasional shooting pains along her mid-back but previously was noted to have demyelination by review of cervical MRIs from 2022. Symptoms have been occurring for years with no increase in frequency or severity. Denies any specific orthopnea, PND or pitting edema. She has made dietary changes over the past year and tries to limit her intake of fast food.  Studies Reviewed:   EKG: EKG is ordered today and demonstrates:   EKG Interpretation Date/Time:  Thursday November 03 2023 15:06:20 EDT Ventricular Rate:  61 PR Interval:  160 QRS Duration:  74 QT Interval:  426 QTC Calculation: 428 R Axis:   60  Text Interpretation: Normal sinus rhythm When compared with ECG of 15-Oct-2022 13:11, No  significant change was found Confirmed by Johnson Laymon ([id number]) on 11/03/2023 3:15:12 PM       GXT: 04/2022   Baseline inferior and lateral precrodial T-wave inversions.   No ST deviation was noted.   Negative exercise stress test for ischemia   Test not indicative of full exercise capacity, test stopped early due to limping and balance concerns. Cannot calculate Duke treadmill score for this reason.  Echocardiogram: 04/2022 IMPRESSIONS     1. Left ventricular ejection fraction, by estimation, is 55 to 60%. The  left ventricle has normal function. The left ventricle has no regional  wall motion abnormalities. There is mild asymmetric left ventricular  hypertrophy of the basal-septal segment.  Left ventricular diastolic parameters are consistent with Grade I  diastolic dysfunction (impaired relaxation). The average left ventricular  global longitudinal strain is -19.8 %. The global longitudinal strain is  normal.   2. Right ventricular systolic function is normal. The right ventricular  size is normal.   3. The mitral valve is abnormal. Mild mitral valve regurgitation. No  evidence of mitral stenosis.   4. The tricuspid valve is abnormal.   5. The aortic valve is tricuspid. Aortic valve regurgitation is not  visualized. No aortic stenosis is present.   6. The inferior vena cava is normal in size with greater than 50%  respiratory variability, suggesting right atrial pressure of 3 mmHg.   Carotid Dopplers: 11/2022 IMPRESSION: Homogeneous plaque at the bilateral carotid bifurcation, with discordant results regarding degree of stenosis by  established duplex criteria. Peak velocity suggests 50%-69% stenosis, with the ICA/ CCA ratio suggesting a lesser degree of stenosis. If establishing a more accurate degree of stenosis is required, cerebral angiogram should be considered, or as a second best test, CTA.   Physical Exam:   VS:  BP 112/60 (BP Location: Left Arm, Cuff Size:  Normal)   Pulse 70   Ht 5' 2 (1.575 m)   Wt 124 lb (56.2 kg)   SpO2 97%   BMI 22.68 kg/m    Wt Readings from Last 3 Encounters:  11/03/23 124 lb (56.2 kg)  10/15/22 134 lb (60.8 kg)  06/15/22 133 lb (60.3 kg)     GEN: Well nourished, well developed female appearing in no acute distress NECK: No JVD; No carotid bruits CARDIAC: RRR, no murmurs, rubs, gallops RESPIRATORY:  Clear to auscultation without rales, wheezing or rhonchi  ABDOMEN: Appears non-distended. No obvious abdominal masses. EXTREMITIES: No clubbing or cyanosis. No pitting edema.  Distal pedal pulses are 2+ bilaterally.   Assessment and Plan:   1. Atypical Chest Pain - GXT in 04/2022 was negative for ischemia and was not fully indicative of exercise capacity due to balance issues. Echocardiogram showed a preserved EF with LVH but no regional wall motion abnormalities and only mild MR. - Her episodes of chest pain as outlined above overall seem atypical for a cardiac etiology as they occur with palpation and her episodes along her back and been occurring for several years and she has a history of demyelination by review of prior imaging as outlined above. I encouraged her to make us  aware if symptoms increase in frequency or severity as we could arrange for a Coronary CTA. Given recent reassuring workup and overall atypical symptoms at this time, will continue with risk factor modification.   2. Palpitations - No reported symptoms. She has not required AV nodal blocking agents.  3. Bilateral carotid artery stenosis/HLD - Carotid dopplers in 11/2022 suggested 50 to 69% stenosis but the ICA/CCA ratio suggested a lesser degree of stenosis. Was started on Crestor  10 mg daily and recent labs in 04/2023 showed her LDL had improved to 64 (previously 97). LFT's WNL.  - Will plan for repeat carotid dopplers in 11/2023. Continue Crestor  10mg  daily.   Signed, Laymon CHRISTELLA Qua, PA-C

## 2023-11-03 ENCOUNTER — Encounter: Payer: Self-pay | Admitting: Student

## 2023-11-03 ENCOUNTER — Ambulatory Visit: Attending: Student | Admitting: Student

## 2023-11-03 VITALS — BP 112/60 | HR 70 | Ht 62.0 in | Wt 124.0 lb

## 2023-11-03 DIAGNOSIS — R002 Palpitations: Secondary | ICD-10-CM

## 2023-11-03 DIAGNOSIS — I6523 Occlusion and stenosis of bilateral carotid arteries: Secondary | ICD-10-CM | POA: Diagnosis not present

## 2023-11-03 DIAGNOSIS — E785 Hyperlipidemia, unspecified: Secondary | ICD-10-CM

## 2023-11-03 DIAGNOSIS — Z87898 Personal history of other specified conditions: Secondary | ICD-10-CM | POA: Diagnosis not present

## 2023-11-03 NOTE — Patient Instructions (Signed)
 Medication Instructions:  Your physician recommends that you continue on your current medications as directed. Please refer to the Current Medication list given to you today.  *If you need a refill on your cardiac medications before your next appointment, please call your pharmacy*  Lab Work: NONE   If you have labs (blood work) drawn today and your tests are completely normal, you will receive your results only by: MyChart Message (if you have MyChart) OR A paper copy in the mail If you have any lab test that is abnormal or we need to change your treatment, we will call you to review the results.  Testing/Procedures: Your physician has requested that you have a carotid duplex. This test is an ultrasound of the carotid arteries in your neck. It looks at blood flow through these arteries that supply the brain with blood. Allow one hour for this exam. There are no restrictions or special instructions.   Follow-Up: At [facility], you and your health needs are our priority.  As part of our continuing mission to provide you with exceptional heart care, our providers are all part of one team.  This team includes your primary Cardiologist (physician) and Advanced Practice Providers or APPs (Physician Assistants and Nurse Practitioners) who all work together to provide you with the care you need, when you need it.  Your next appointment:   6 month(s)  Provider:   You may see Alvan Carrier, MD or one of the following Advanced Practice Providers on your designated Care Team:   Laymon Qua, PA-C  [place], [place] Olivia Pavy, [place]     We recommend signing up for the patient portal called MyChart.  Sign up information is provided on this After Visit Summary.  MyChart is used to connect with patients for Virtual Visits (Telemedicine).  Patients are able to view lab/test results, encounter notes, upcoming appointments, etc.  Non-urgent messages can be sent to your provider  as well.   To learn more about what you can do with MyChart, go to [email or web address].   Other Instructions Thank you for choosing [facility]!

## 2023-11-29 ENCOUNTER — Ambulatory Visit (HOSPITAL_COMMUNITY)
Admission: RE | Admit: 2023-11-29 | Discharge: 2023-11-29 | Disposition: A | Discharge status: Home / Self Care | Source: Ambulatory Visit | Attending: Student | Admitting: Student

## 2023-11-29 DIAGNOSIS — I6523 Occlusion and stenosis of bilateral carotid arteries: Secondary | ICD-10-CM | POA: Diagnosis not present

## 2023-11-29 DIAGNOSIS — Z0389 Encounter for observation for other suspected diseases and conditions ruled out: Secondary | ICD-10-CM | POA: Diagnosis not present

## 2023-11-30 ENCOUNTER — Ambulatory Visit: Payer: Self-pay | Admitting: Student

## 2024-03-08 ENCOUNTER — Other Ambulatory Visit (HOSPITAL_COMMUNITY): Payer: Self-pay | Admitting: Internal Medicine

## 2024-03-08 DIAGNOSIS — Z1231 Encounter for screening mammogram for malignant neoplasm of breast: Secondary | ICD-10-CM

## 2024-04-23 ENCOUNTER — Ambulatory Visit (HOSPITAL_COMMUNITY)

## 2024-04-26 ENCOUNTER — Encounter (HOSPITAL_COMMUNITY): Payer: Self-pay

## 2024-04-26 ENCOUNTER — Inpatient Hospital Stay (HOSPITAL_COMMUNITY): Admission: RE | Admit: 2024-04-26 | Discharge: 2024-04-26 | Attending: Internal Medicine | Admitting: Internal Medicine

## 2024-04-26 DIAGNOSIS — Z1231 Encounter for screening mammogram for malignant neoplasm of breast: Secondary | ICD-10-CM
